# Patient Record
Sex: Female | Born: 1985 | Race: White | Hispanic: No | Marital: Single | Smoking: Never smoker
Health system: Southern US, Community
[De-identification: ages and names within clinical notes are randomized; demographics above are authoritative.]

## PROBLEM LIST (undated history)

## (undated) DIAGNOSIS — M51369 Other intervertebral disc degeneration, lumbar region without mention of lumbar back pain or lower extremity pain: Secondary | ICD-10-CM

## (undated) DIAGNOSIS — R7303 Prediabetes: Secondary | ICD-10-CM

## (undated) DIAGNOSIS — F419 Anxiety disorder, unspecified: Secondary | ICD-10-CM

## (undated) DIAGNOSIS — J45909 Unspecified asthma, uncomplicated: Secondary | ICD-10-CM

## (undated) DIAGNOSIS — R519 Headache, unspecified: Secondary | ICD-10-CM

## (undated) DIAGNOSIS — D649 Anemia, unspecified: Secondary | ICD-10-CM

## (undated) DIAGNOSIS — M5136 Other intervertebral disc degeneration, lumbar region: Secondary | ICD-10-CM

## (undated) HISTORY — DX: Unspecified asthma, uncomplicated: J45.909

## (undated) HISTORY — PX: BACK SURGERY: SHX140

---
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
## 2009-10-31 ENCOUNTER — Ambulatory Visit (HOSPITAL_COMMUNITY): Admission: RE | Admit: 2009-10-31 | Discharge: 2009-10-31 | Payer: Self-pay | Admitting: Obstetrics and Gynecology

## 2009-11-05 ENCOUNTER — Inpatient Hospital Stay (HOSPITAL_COMMUNITY): Admission: AD | Admit: 2009-11-05 | Discharge: 2009-11-07 | Payer: Self-pay | Admitting: Obstetrics and Gynecology

## 2010-12-25 LAB — CBC
HCT: 26.1 % — ABNORMAL LOW (ref 36.0–46.0)
HCT: 37.3 % (ref 36.0–46.0)
Hemoglobin: 12.4 g/dL (ref 12.0–15.0)
Hemoglobin: 8.7 g/dL — ABNORMAL LOW (ref 12.0–15.0)
MCHC: 33.1 g/dL (ref 30.0–36.0)
MCHC: 33.5 g/dL (ref 30.0–36.0)
MCV: 93.3 fL (ref 78.0–100.0)
MCV: 94.5 fL (ref 78.0–100.0)
Platelets: 146 10*3/uL — ABNORMAL LOW (ref 150–400)
Platelets: 193 10*3/uL (ref 150–400)
RBC: 2.77 MIL/uL — ABNORMAL LOW (ref 3.87–5.11)
RBC: 4 MIL/uL (ref 3.87–5.11)
RDW: 13.8 % (ref 11.5–15.5)
RDW: 13.8 % (ref 11.5–15.5)
WBC: 10.4 10*3/uL (ref 4.0–10.5)
WBC: 11 10*3/uL — ABNORMAL HIGH (ref 4.0–10.5)

## 2010-12-25 LAB — RPR: RPR Ser Ql: NONREACTIVE

## 2012-01-17 ENCOUNTER — Emergency Department (HOSPITAL_COMMUNITY)
Admission: EM | Admit: 2012-01-17 | Discharge: 2012-01-18 | Disposition: A | Discharge status: Home / Self Care | Payer: 59 | Attending: Emergency Medicine | Admitting: Emergency Medicine

## 2012-01-17 ENCOUNTER — Encounter (HOSPITAL_COMMUNITY): Payer: Self-pay | Admitting: *Deleted

## 2012-01-17 ENCOUNTER — Emergency Department (HOSPITAL_COMMUNITY): Payer: 59

## 2012-01-17 DIAGNOSIS — S63509A Unspecified sprain of unspecified wrist, initial encounter: Secondary | ICD-10-CM | POA: Insufficient documentation

## 2012-01-17 DIAGNOSIS — S6992XA Unspecified injury of left wrist, hand and finger(s), initial encounter: Secondary | ICD-10-CM

## 2012-01-17 MED ORDER — HYDROCODONE-ACETAMINOPHEN 5-325 MG PO TABS: 1.0000 | ORAL_TABLET | ORAL | Status: AC | PRN

## 2012-01-17 MED ORDER — OXYCODONE-ACETAMINOPHEN 5-325 MG PO TABS
2.0000 | ORAL_TABLET | Freq: Once | ORAL | Status: AC
  Administered 2012-01-17: 2 via ORAL
  Filled 2012-01-17: qty 2

## 2012-01-17 NOTE — ED Notes (Signed)
Pt injured L FA secondary to fall from horse.

## 2012-01-17 NOTE — Discharge Instructions (Signed)
Athletic Injuries Proper early treatment and rehabilitation leads to a quicker recovery for most athletic injuries. You may be able to return to your sport fully recovered in less time if you follow these general rules:   Rest. Rest the injury until movement is no longer painful. Using an injured joint or muscle will prolong the problem.   Elevate. Keep the injured area elevated until most of the swelling and pain are gone. If possible, keep the injured area above the level of your heart.   Ice. Use ice packs directly on the injury for 3 to 4 days.   Compression. Use an elastic bandage applied to your injury as directed. This will reduce swelling, although elastic wraps do not protect injured joints. More rigid splints and taping are better for this purpose.   Rehabilitation. This should begin as soon as the swelling and pain of your injury subside, and as directed by your caregiver. It includes exercises to improve joint motion and muscular strength. Occasionally special braces, splints, or orthotics are used to protect against further injury when you return to your sport.  Keeping a positive attitude will help you heal your injury more rapidly and completely. You may return to physical exercise that does not cause pain or increase the risk of re-injury or as directed. This will help maintain fitness. It will also improve your mental attitude. Do not overuse your injured extremity. This will lead to discomfort and may delay full recovery.  Document Released: 10/29/2004 Document Revised: 09/10/2011 Document Reviewed: 03/19/2009 [facility] Patient Information 2012 [place], [place].Muscle Strain A muscle strain, or pulled muscle, occurs when a muscle is over-stretched. A small number of muscle fibers may also be torn. This is especially common in athletes. This happens when a sudden violent force placed on a muscle pushes it past its capacity. Usually, recovery from a pulled muscle takes 1 to 2 weeks. But  complete healing will take 5 to 6 weeks. There are millions of muscle fibers. Following injury, your body will usually return to normal quickly. HOME CARE INSTRUCTIONS   While awake, apply ice to the sore muscle for 15 to 20 minutes each hour for the first 2 days. Put ice in a plastic bag and place a towel between the bag of ice and your skin.   Do not use the pulled muscle for several days. Do not use the muscle if you have pain.   You may wrap the injured area with an elastic bandage for comfort. Be careful not to bind it too tightly. This may interfere with blood circulation.   Only take over-the-counter or prescription medicines for pain, discomfort, or fever as directed by your caregiver. Do not use aspirin as this will increase bleeding (bruising) at injury site.   Warming up before exercise helps prevent muscle strains.  SEEK MEDICAL CARE IF:  There is increased pain or swelling in the affected area. MAKE SURE YOU:   Understand these instructions.   Will watch your condition.   Will get help right away if you are not doing well or get worse.  Document Released: 09/21/2005 Document Revised: 09/10/2011 Document Reviewed: 04/20/2007 [facility] Patient Information 2012 [place], [place].

## 2012-01-18 NOTE — ED Provider Notes (Signed)
History     CSN: [id number]  Arrival date & time 01/17/12  2104   First MD Initiated Contact with Patient 01/17/12 2154      Chief Complaint  Patient presents with  . Arm Injury    (Consider location/radiation/quality/duration/timing/severity/associated sxs/prior treatment) HPI  Pt presents to the ED with complaints of left wrist pain after being bucked off of a horse. She states that it happened earlier today and that she did not hurt anything else. Did not hit her head, no LOC, no other injuries. She did not realize she broke her fall with her wrist until it started hurting after she calmed down. Now she is guarding and does not want to move it. Pt is in no acute distress but is in a lot of pain   History reviewed. No pertinent past medical history.  History reviewed. No pertinent past surgical history.  History reviewed. No pertinent family history.  History  Substance Use Topics  . Smoking status: Never Smoker   . Smokeless tobacco: Not on file  . Alcohol Use: No    OB History    Grav Para Term Preterm Abortions TAB SAB Ect Mult Living                  Review of Systems  All other systems reviewed and are negative.    Allergies  Review of patient's allergies indicates no known allergies.  Home Medications   Current Outpatient Rx  Name Route Sig Dispense Refill  . AMOXICILLIN 500 MG PO CAPS Oral Take 500 mg by mouth 3 (three) times daily. For 7 days.    Marland Kitchen HYDROCODONE-ACETAMINOPHEN 5-500 MG PO TABS Oral Take 1 tablet by mouth every 6 (six) hours as needed. Pain.    Marland Kitchen HYDROCODONE-ACETAMINOPHEN 5-325 MG PO TABS Oral Take 1 tablet by mouth every 4 (four) hours as needed for pain. 15 tablet 0    BP 111/75  Pulse 71  Temp(Src) 99.2 F (37.3 C) (Oral)  Resp 18  Ht 5\' 7"  (1.702 m)  Wt 206 lb 3.2 oz (93.532 kg)  BMI 32.30 kg/m2  SpO2 100%  LMP 01/16/2012  Physical Exam  Nursing note and vitals reviewed. Constitutional: She appears well-developed and  well-nourished. No distress.  HENT:  Head: Normocephalic and atraumatic.  Eyes: Pupils are equal, round, and reactive to light.  Neck: Normal range of motion. Neck supple.  Cardiovascular: Normal rate and regular rhythm.   Pulmonary/Chest: Effort normal.  Abdominal: Soft.  Musculoskeletal:       Left hand: She exhibits decreased range of motion (Pt will not move wrist), tenderness (very tender) and swelling. She exhibits no bony tenderness, normal two-point discrimination, normal capillary refill, no deformity and no laceration. normal sensation noted. Normal strength noted.  Neurological: She is alert.  Skin: Skin is warm and dry.    ED Course  Procedures (including critical care time)  Labs Reviewed - No data to display Dg Wrist Complete Left  01/17/2012  *RADIOLOGY REPORT*  Clinical Data: Larey Seat off horse.  Pain.  LEFT WRIST - COMPLETE 3+ VIEW  Comparison: None.  Findings: The carpals are aligned.  No acute fracture is identified.  On the lateral view, there may be slight soft tissue swelling along the volar aspect of the wrist.  IMPRESSION:  1.  No acute bony abnormality identified. 2.  Question slight soft tissue swelling volar to the wrist.  If there is continued concern for fracture, consider follow-up radiographs in 7 days.  Original Report  Authenticated By: Britta Mccreedy, M.D.     1. Left wrist injury   2. Wrist sprain       MDM  Due to patients tenderness, I suspect fracture although no obvious fracture seen on x-ray. Pt has pain with pronation and supination therefore sugar tong splint placed then sling. Pt is to see Dr. Mina Marble in 7 days for repeat xray and further management.  Rx for vicodin given. I have discussed symptoms of Compartment syndrome and that she needs to come back immediately if she develops any of the symptoms.  Pt has been advised of the symptoms that warrant their return to the ED. Patient has voiced understanding and has agreed to follow-up with the PCP  or specialist.          Dorthula Matas, PA 01/18/12 0040

## 2012-01-20 NOTE — ED Provider Notes (Signed)
Medical screening examination/treatment/procedure(s) were performed by non-physician practitioner and as supervising physician I was immediately available for consultation/collaboration.  Raeford Razor, MD 01/20/12 [phone]

## 2013-11-13 ENCOUNTER — Ambulatory Visit: Payer: 59 | Admitting: Dietician

## 2015-02-06 ENCOUNTER — Emergency Department: Payer: Commercial Managed Care - PPO

## 2015-02-06 ENCOUNTER — Emergency Department
Admission: EM | Admit: 2015-02-06 | Discharge: 2015-02-06 | Disposition: A | Discharge status: Home / Self Care | Payer: Commercial Managed Care - PPO | Attending: Emergency Medicine | Admitting: Emergency Medicine

## 2015-02-06 DIAGNOSIS — Z79899 Other long term (current) drug therapy: Secondary | ICD-10-CM | POA: Insufficient documentation

## 2015-02-06 DIAGNOSIS — R51 Headache: Secondary | ICD-10-CM | POA: Diagnosis present

## 2015-02-06 DIAGNOSIS — J301 Allergic rhinitis due to pollen: Secondary | ICD-10-CM | POA: Diagnosis not present

## 2015-02-06 MED ORDER — METHYLPREDNISOLONE 4 MG PO TBPK: ORAL_TABLET | ORAL | Status: DC

## 2015-02-06 MED ORDER — DEXAMETHASONE SODIUM PHOSPHATE 10 MG/ML IJ SOLN
INTRAMUSCULAR | Status: AC
  Administered 2015-02-06: 10 mg via INTRAMUSCULAR
  Filled 2015-02-06: qty 1

## 2015-02-06 MED ORDER — FEXOFENADINE-PSEUDOEPHED ER 60-120 MG PO TB12: 1.0000 | ORAL_TABLET | Freq: Two times a day (BID) | ORAL | Status: DC

## 2015-02-06 MED ORDER — DEXAMETHASONE SODIUM PHOSPHATE 10 MG/ML IJ SOLN
10.0000 mg | Freq: Once | INTRAMUSCULAR | Status: AC
  Administered 2015-02-06: 10 mg via INTRAMUSCULAR

## 2015-02-06 NOTE — ED Notes (Signed)
Pt was treated for pneumonia a few months ago states still having bilat earaches, head pressure, and congestion

## 2015-02-06 NOTE — Discharge Instructions (Signed)

## 2015-02-06 NOTE — ED Provider Notes (Signed)
[facility] Emergency Department Provider Note    ____________________________________________  Time seen: 2026  I have reviewed the triage vital signs and the nursing notes.   HISTORY  Chief Complaint Headache  Patient c/o frontal headache, bilateral ear pressure and chest congestion.     HPI Megan Barrera is a 29 y.o. female complaining of frontal headache, bilateral ear pressure, and chest congestion for 4 months.  States onset January this year, diagnosed with pneumonia  and took two dosage of antibiotics.  Patient has follow up with Urgent Care clinics and not getting better. Rate her facial pain as 8/10. States pain is 2nd to pressure. Currently of Antibiotic for sinus infection.     No past medical history on file.  There are no active problems to display for this patient.   No past surgical history on file.  Current Outpatient Rx  Name  Route  Sig  Dispense  Refill  . amoxicillin (AMOXIL) 500 MG capsule   Oral   Take 500 mg by mouth 3 (three) times daily. For 7 days.         . fexofenadine-pseudoephedrine (ALLEGRA-D) 60-120 MG per tablet   Oral   Take 1 tablet by mouth 2 (two) times daily.   30 tablet   0   . HYDROcodone-acetaminophen (VICODIN) 5-500 MG per tablet   Oral   Take 1 tablet by mouth every 6 (six) hours as needed. Pain.         . methylPREDNISolone (MEDROL DOSEPAK) 4 MG TBPK tablet      Take Tapered dose as directed   21 tablet   0     Allergies Review of patient's allergies indicates no known allergies.  No family history on file.  Social History History  Substance Use Topics  . Smoking status: Never Smoker   . Smokeless tobacco: Not on file  . Alcohol Use: No    Review of Systems  Constitutional: Negative for fever. Eyes: Negative for visual changes. ENT: Negative for sore throat. Positive for facial/ear pressure and pain. Cardiovascular: Negative for chest pain. Respiratory: Negative for  shortness of breath. Gastrointestinal: Negative for abdominal pain, vomiting and diarrhea. Genitourinary: Negative for dysuria. Musculoskeletal: Negative for back pain. Skin: Negative for rash. Neurological: Positive for frontal headaches. No focal weakness or numbness. Psychiatric:None Endocrine:None Hematological/Lymphatic:None Allergic/Immunilogical: None  10-point ROS otherwise negative.  ____________________________________________   PHYSICAL EXAM:  VITAL SIGNS: ED Triage Vitals  Enc Vitals Group     BP 02/06/15 1949 132/94 mmHg     Pulse Rate 02/06/15 1949 75     Resp --      Temp 02/06/15 1949 97.5 F (36.4 C)     Temp Source 02/06/15 1949 Oral     SpO2 02/06/15 1949 100 %     Weight 02/06/15 1949 240 lb (108.863 kg)     Height 02/06/15 1949 5\' 6"  (1.676 m)     Head Cir --      Peak Flow --      Pain Score 02/06/15 1950 8     Pain Loc --      Pain Edu? --      Excl. in GC? --      Constitutional: Alert and oriented. Well appearing and in no distress. Eyes: Conjunctivae are normal. PERRL. Normal extraocular movements. ENT   Head: Normocephalic and atraumatic.   Nose: Clear Rhinorrhea/edamatous nasal turbinates.   Mouth/Throat: Mucous membranes are moist.   Neck: No stridor. Hematological/Lymphatic/Immunilogical: No cervical  lymphadenopathy. Cardiovascular: Normal rate, regular rhythm. Normal and symmetric distal pulses are present in all extremities. No murmurs, rubs, or gallops. Respiratory: Normal respiratory effort without tachypnea nor retractions. Breath sounds are clear and equal bilaterally. No wheezes/rales/rhonchi. Gastrointestinal: Soft and nontender. No distention. No abdominal bruits. There is no CVA tenderness. Genitourinary: Not examined Musculoskeletal: Nontender with normal range of motion in all extremities. No joint effusions.  No lower extremity tenderness nor edema. Neurologic:  Normal speech and language. No gross focal  neurologic deficits are appreciated. Speech is normal. No gait instability. Skin:  Skin is warm, dry and intact. No rash noted. Psychiatric: Mood and affect are normal. Speech and behavior are normal. Patient exhibits appropriate insight and judgment.  ____________________________________________    LABS (pertinent positives/negatives)    ____________________________________________   EKG    ____________________________________________    RADIOLOGY  Neg sinus and chest X-ray  ____________________________________________   PROCEDURES  Procedure(s) performed: None  Critical Care performed: No  ____________________________________________   INITIAL IMPRESSION / ASSESSMENT AND PLAN / ED COURSE  Pertinent labs & imaging results that were available during my care of the patient were reviewed by me and considered in my medical decision making (see chart for details).  Allergic rhinitis  ____________________________________________   FINAL CLINICAL IMPRESSION(S) / ED DIAGNOSES  Final diagnoses:  Allergic rhinitis due to pollen     Joni ReiningRonald K Smith, PA-C 02/06/15 2154  Arelia Longestavid M Schaevitz, MD 02/07/15 [phone]

## 2016-03-06 ENCOUNTER — Other Ambulatory Visit: Payer: Self-pay | Admitting: *Deleted

## 2016-03-06 NOTE — Telephone Encounter (Addendum)
Received fax for refill Omeprazole 40 mg denied refill patient needs ov last ov 04/16/15 per paper chart patient to f/u in 6 months

## 2016-03-11 ENCOUNTER — Emergency Department (HOSPITAL_COMMUNITY)
Admission: EM | Admit: 2016-03-11 | Discharge: 2016-03-11 | Disposition: A | Discharge status: Home / Self Care | Payer: Commercial Managed Care - PPO | Attending: Emergency Medicine | Admitting: Emergency Medicine

## 2016-03-11 ENCOUNTER — Encounter (HOSPITAL_COMMUNITY): Payer: Self-pay | Admitting: Emergency Medicine

## 2016-03-11 DIAGNOSIS — M549 Dorsalgia, unspecified: Secondary | ICD-10-CM

## 2016-03-11 DIAGNOSIS — Z79899 Other long term (current) drug therapy: Secondary | ICD-10-CM | POA: Insufficient documentation

## 2016-03-11 DIAGNOSIS — Z7952 Long term (current) use of systemic steroids: Secondary | ICD-10-CM | POA: Insufficient documentation

## 2016-03-11 DIAGNOSIS — Z792 Long term (current) use of antibiotics: Secondary | ICD-10-CM | POA: Insufficient documentation

## 2016-03-11 DIAGNOSIS — M545 Low back pain: Secondary | ICD-10-CM | POA: Insufficient documentation

## 2016-03-11 HISTORY — DX: Other intervertebral disc degeneration, lumbar region: M51.36

## 2016-03-11 HISTORY — DX: Other intervertebral disc degeneration, lumbar region without mention of lumbar back pain or lower extremity pain: M51.369

## 2016-03-11 MED ORDER — HYDROCODONE-ACETAMINOPHEN 5-325 MG PO TABS: 1.0000 | ORAL_TABLET | ORAL | Status: DC | PRN

## 2016-03-11 MED ORDER — HYDROCODONE-ACETAMINOPHEN 5-325 MG PO TABS
2.0000 | ORAL_TABLET | Freq: Once | ORAL | Status: AC
  Administered 2016-03-11: 2 via ORAL
  Filled 2016-03-11: qty 2

## 2016-03-11 NOTE — Discharge Instructions (Signed)
Take the prescribed medication as directed.  You may take this with the mobic and robaxin. Follow-up with your spine doctor/surgeon. Return to the ED for new or worsening symptoms.

## 2016-03-11 NOTE — ED Provider Notes (Signed)
CSN: [id number]     Arrival date & time 03/11/16  0556 History   First MD Initiated Contact with Patient 03/11/16 0615     Chief Complaint  Patient presents with  . Back Pain     (Consider location/radiation/quality/duration/timing/severity/associated sxs/prior Treatment) Patient is a 30 y.o. female presenting with back pain. The history is provided by the patient and medical records.  Back Pain   29 y.o. F with hx of DDD, presenting to the ED for back pain.  Patient states she was diagnosed with DDD several years ago.  She states over the past week or so her back has been more painful than normal.  Denies any new injury, trauma, or falls.  States she [facility], was started on mobic and robaxin which does not seem to be helping.  She states her right leg feels tingly like it is "asleep".  Denies numbness or weakness.  No bowel or bladder incontinence.  States she is scheduled to see neurosurgeon at the end of the month to possibly start spinal injections vs surgical intervention.  No fever, chills, sweats.  No hx of cancer or IVDU.  VSS.  Past Medical History  Diagnosis Date  . DDD (degenerative disc disease), lumbar    History reviewed. No pertinent past surgical history. No family history on file. Social History  Substance Use Topics  . Smoking status: Never Smoker   . Smokeless tobacco: None  . Alcohol Use: No   OB History    Gravida Para Term Preterm AB TAB SAB Ectopic Multiple Living   1 1 1             Review of Systems  Musculoskeletal: Positive for back pain.  All other systems reviewed and are negative.     Allergies  Review of patient's allergies indicates no known allergies.  Home Medications   Prior to Admission medications   Medication Sig Start Date End Date Taking? Authorizing Provider  amoxicillin (AMOXIL) 500 MG capsule Take 500 mg by mouth 3 (three) times daily. For 7 days. 01/14/12   Historical Provider, MD   fexofenadine-pseudoephedrine (ALLEGRA-D) 60-120 MG per tablet Take 1 tablet by mouth 2 (two) times daily. 02/06/15   Joni Reiningonald K Smith, PA-C  HYDROcodone-acetaminophen (VICODIN) 5-500 MG per tablet Take 1 tablet by mouth every 6 (six) hours as needed. Pain.    Historical Provider, MD  methylPREDNISolone (MEDROL DOSEPAK) 4 MG TBPK tablet Take Tapered dose as directed 02/06/15   Joni Reiningonald K Smith, PA-C   BP 126/71 mmHg  Pulse 79  Temp(Src) 97.9 F (36.6 C) (Oral)  Resp 20  Ht 5\' 6"  (1.676 m)  Wt 105.235 kg  BMI 37.46 kg/m2  SpO2 99%  LMP 12/04/2015   Physical Exam  Constitutional: She is oriented to person, place, and time. She appears well-developed and well-nourished. No distress.  HENT:  Head: Normocephalic and atraumatic.  Mouth/Throat: Oropharynx is clear and moist.  Eyes: Conjunctivae and EOM are normal. Pupils are equal, round, and reactive to light.  Neck: Normal range of motion. Neck supple.  Cardiovascular: Normal rate, regular rhythm and normal heart sounds.   Pulmonary/Chest: Effort normal and breath sounds normal. No respiratory distress. She has no wheezes.  Abdominal: Soft. Bowel sounds are normal. There is no tenderness. There is no guarding.  Musculoskeletal: Normal range of motion.  TTP of midline lumbar spine as well as right SI joint; no deformities noted; + SLR on right, - on left; normal  strength and sensation of BLE; no foot drop; normal gait;   Neurological: She is alert and oriented to person, place, and time.  Skin: Skin is warm and dry. She is not diaphoretic.  Psychiatric: She has a normal mood and affect.  Nursing note and vitals reviewed.   ED Course  Procedures (including critical care time) Labs Review Labs Reviewed - No data to display  Imaging Review No results found. I have personally reviewed and evaluated these images and lab results as part of my medical decision-making.   EKG Interpretation None      MDM   Final diagnoses:  Back pain,  unspecified location   30 year old female is here with back pain. This seems chronic in nature for the past several years. Seen yesterday at spine clinic and started on Mobic and Robaxin without relief. Patient is afebrile, nontoxic. She has tenderness of her lumbar spine as well as the right SI joint without noted deformity. Positive straight leg raise on right. No numbness, weakness, or bowel or bladder incontinence to suggest cauda equina.  Do not suspect acute fracture without recent trauma.  Do not feel she needs emergent MRI at this time.  Exam findings are more consistent with a lumbar radiculopathy.  Will prescribe short course norco to help with pain.  Scheduled to see neurosurgeon later this month for further management.  Discussed plan with patient, he/she acknowledged understanding and agreed with plan of care.  Return precautions given for new or worsening symptoms.  Garlon Hatchet, PA-C 03/11/16 6962  Layla Maw Ward, DO 03/11/16 [phone]

## 2016-03-11 NOTE — ED Notes (Signed)
Patient to ED c/o worsening back pain r/t to her DDD since yesterday. She was [facility] and was sent home with a prescription for Mobic, which has not helped in addition to her methocarbamol. Patient states her pain is so bad she can hardly walk. CMS intact all extremities, but reports pain with movement.

## 2017-09-29 ENCOUNTER — Other Ambulatory Visit: Payer: Self-pay | Admitting: Orthopaedic Surgery

## 2017-09-29 DIAGNOSIS — M5136 Other intervertebral disc degeneration, lumbar region: Secondary | ICD-10-CM

## 2017-09-30 ENCOUNTER — Ambulatory Visit
Admission: RE | Admit: 2017-09-30 | Discharge: 2017-09-30 | Disposition: A | Discharge status: Home / Self Care | Payer: Commercial Managed Care - PPO | Source: Ambulatory Visit | Attending: Orthopaedic Surgery | Admitting: Orthopaedic Surgery

## 2017-09-30 DIAGNOSIS — M5136 Other intervertebral disc degeneration, lumbar region: Secondary | ICD-10-CM

## 2018-02-22 ENCOUNTER — Other Ambulatory Visit
Admission: RE | Admit: 2018-02-22 | Discharge: 2018-02-22 | Disposition: A | Discharge status: Home / Self Care | Payer: Managed Care, Other (non HMO) | Source: Ambulatory Visit | Attending: Pediatrics | Admitting: Pediatrics

## 2018-02-22 DIAGNOSIS — R509 Fever, unspecified: Secondary | ICD-10-CM | POA: Insufficient documentation

## 2018-02-22 DIAGNOSIS — M7989 Other specified soft tissue disorders: Secondary | ICD-10-CM | POA: Diagnosis not present

## 2018-02-22 LAB — FIBRIN DERIVATIVES D-DIMER (ARMC ONLY): Fibrin derivatives D-dimer (ARMC): 398.74 ng/mL (FEU) (ref 0.00–499.00)

## 2018-02-24 ENCOUNTER — Emergency Department (HOSPITAL_COMMUNITY)
Admission: EM | Admit: 2018-02-24 | Discharge: 2018-02-25 | Disposition: A | Discharge status: Home / Self Care | Payer: Managed Care, Other (non HMO) | Attending: Emergency Medicine | Admitting: Emergency Medicine

## 2018-02-24 ENCOUNTER — Other Ambulatory Visit: Payer: Self-pay

## 2018-02-24 ENCOUNTER — Encounter (HOSPITAL_COMMUNITY): Payer: Self-pay | Admitting: Emergency Medicine

## 2018-02-24 DIAGNOSIS — R509 Fever, unspecified: Secondary | ICD-10-CM | POA: Diagnosis present

## 2018-02-24 DIAGNOSIS — Z79899 Other long term (current) drug therapy: Secondary | ICD-10-CM | POA: Diagnosis not present

## 2018-02-24 DIAGNOSIS — R52 Pain, unspecified: Secondary | ICD-10-CM | POA: Diagnosis not present

## 2018-02-24 DIAGNOSIS — Z9189 Other specified personal risk factors, not elsewhere classified: Secondary | ICD-10-CM

## 2018-02-24 LAB — URINALYSIS, ROUTINE W REFLEX MICROSCOPIC
BILIRUBIN URINE: NEGATIVE
Glucose, UA: NEGATIVE mg/dL
Hgb urine dipstick: NEGATIVE
KETONES UR: NEGATIVE mg/dL
LEUKOCYTES UA: NEGATIVE
Nitrite: NEGATIVE
Protein, ur: NEGATIVE mg/dL
SPECIFIC GRAVITY, URINE: 1.023 (ref 1.005–1.030)
pH: 5 (ref 5.0–8.0)

## 2018-02-24 LAB — CBC WITH DIFFERENTIAL/PLATELET
Basophils Absolute: 0 10*3/uL (ref 0.0–0.1)
Basophils Relative: 1 %
EOS ABS: 0.2 10*3/uL (ref 0.0–0.7)
Eosinophils Relative: 9 %
HCT: 39.8 % (ref 36.0–46.0)
Hemoglobin: 12.7 g/dL (ref 12.0–15.0)
LYMPHS ABS: 0.6 10*3/uL — AB (ref 0.7–4.0)
Lymphocytes Relative: 33 %
MCH: 29.2 pg (ref 26.0–34.0)
MCHC: 31.9 g/dL (ref 30.0–36.0)
MCV: 91.5 fL (ref 78.0–100.0)
MONO ABS: 0.3 10*3/uL (ref 0.1–1.0)
Monocytes Relative: 15 %
NEUTROS PCT: 42 %
Neutro Abs: 0.7 10*3/uL — ABNORMAL LOW (ref 1.7–7.7)
PLATELETS: 255 10*3/uL (ref 150–400)
RBC: 4.35 MIL/uL (ref 3.87–5.11)
RDW: 12.3 % (ref 11.5–15.5)
WBC: 1.8 10*3/uL — AB (ref 4.0–10.5)

## 2018-02-24 LAB — COMPREHENSIVE METABOLIC PANEL
ALK PHOS: 85 U/L (ref 38–126)
ALT: 37 U/L (ref 14–54)
ANION GAP: 6 (ref 5–15)
AST: 45 U/L — AB (ref 15–41)
Albumin: 3.6 g/dL (ref 3.5–5.0)
BILIRUBIN TOTAL: 0.4 mg/dL (ref 0.3–1.2)
BUN: 6 mg/dL (ref 6–20)
CALCIUM: 8.6 mg/dL — AB (ref 8.9–10.3)
CO2: 28 mmol/L (ref 22–32)
Chloride: 104 mmol/L (ref 101–111)
Creatinine, Ser: 0.86 mg/dL (ref 0.44–1.00)
GFR calc Af Amer: >60 mL/min (ref >60)
GFR calc non Af Amer: >60 mL/min (ref >60)
GLUCOSE: 95 mg/dL (ref 65–99)
Potassium: 3.5 mmol/L (ref 3.5–5.1)
SODIUM: 138 mmol/L (ref 135–145)
TOTAL PROTEIN: 7 g/dL (ref 6.5–8.1)

## 2018-02-24 LAB — I-STAT CG4 LACTIC ACID, ED
Lactic Acid, Venous: 0.75 mmol/L (ref 0.5–1.9)
Lactic Acid, Venous: 0.51 mmol/L (ref 0.5–1.9)

## 2018-02-24 LAB — CK: Total CK: 439 U/L — ABNORMAL HIGH (ref 38–234)

## 2018-02-24 MED ORDER — KETOROLAC TROMETHAMINE 30 MG/ML IJ SOLN
30.0000 mg | Freq: Once | INTRAMUSCULAR | Status: AC
  Administered 2018-02-25: 30 mg via INTRAVENOUS
  Filled 2018-02-24: qty 1

## 2018-02-24 MED ORDER — ACETAMINOPHEN 325 MG PO TABS
650.0000 mg | ORAL_TABLET | Freq: Once | ORAL | Status: AC | PRN
  Administered 2018-02-24: 650 mg via ORAL
  Filled 2018-02-24: qty 2

## 2018-02-24 MED ORDER — MORPHINE SULFATE (PF) 4 MG/ML IV SOLN
4.0000 mg | Freq: Once | INTRAVENOUS | Status: AC
  Administered 2018-02-24: 4 mg via INTRAVENOUS
  Filled 2018-02-24: qty 1

## 2018-02-24 MED ORDER — SODIUM CHLORIDE 0.9 % IV BOLUS
1000.0000 mL | Freq: Once | INTRAVENOUS | Status: AC
  Administered 2018-02-24: 1000 mL via INTRAVENOUS

## 2018-02-24 NOTE — ED Triage Notes (Signed)
Pt reports headache, fever, nausea, generalized body aches starting Tuesday morning. Pt was seen at [facility] clinic and given doxycycline. Pt reports she had a tick crawling on foot Saturday after pulling them off her horse. Pt reports no improvement, temp of 103 at home last night.

## 2018-02-24 NOTE — ED Provider Notes (Signed)
Patient placed in Quick Look pathway, seen and evaluated   Chief Complaint: fever, fatigue  HPI:   Pt is on doxycycline for a tick born illness  ROS: bodyaches, fever  Physical Exam:   Gen: No distress  Neuro: Awake and Alert  Skin: Warm    Focused Exam: Lungs clear, Heart RRR    Initiation of care has begun. The patient has been counseled on the process, plan, and necessity for staying for the completion/evaluation, and the remainder of the medical screening examination   Megan Barrera 02/24/18 1731    Gerhard Munch, MD 02/26/18 [phone]

## 2018-02-25 LAB — PATHOLOGIST SMEAR REVIEW

## 2018-02-25 MED ORDER — IBUPROFEN 800 MG PO TABS: 800.0000 mg | ORAL_TABLET | Freq: Three times a day (TID) | ORAL | 0 refills | Status: DC | PRN

## 2018-02-25 MED ORDER — OXYCODONE-ACETAMINOPHEN 5-325 MG PO TABS: 1.0000 | ORAL_TABLET | Freq: Four times a day (QID) | ORAL | 0 refills | Status: DC | PRN

## 2018-02-25 MED ORDER — DOXYCYCLINE HYCLATE 100 MG PO CAPS: 100.0000 mg | ORAL_CAPSULE | Freq: Two times a day (BID) | ORAL | 0 refills | Status: DC

## 2018-02-25 NOTE — Discharge Instructions (Addendum)
Return here as needed.  Follow-up with your primary doctor.  Increase your fluid intake and rest as much as possible. °

## 2018-02-25 NOTE — ED Provider Notes (Signed)
[facility] EMERGENCY DEPARTMENT Provider Note   CSN: [id number] Arrival date & time: 02/24/18  1707     History   Chief Complaint Chief Complaint  Patient presents with  . Fever  . Generalized Body Aches  . Migraine    HPI Megan Barrera is a 32 y.o. female.  HPI Patient presents to the emergency department with fever body aches sore throat and headache over the last 4 days.  Patient states this past Saturday she found a tick on her foot.  Patient is being treated with doxycycline by her doctor.  The initial Lyme testing was negative and the  RMFS testing was negative.  The patient states that she has had no significant cough.  Patient states that she does take Tylenol and Motrin which seems to temporarily relieve her symptoms.  The patient denies chest pain, shortness of breath, blurred vision, neck pain cough, weakness, numbness, dizziness, anorexia, edema, abdominal pain, nausea, vomiting, diarrhea, rash, back pain, dysuria, hematemesis, bloody stool, near syncope, or syncope. Past Medical History:  Diagnosis Date  . DDD (degenerative disc disease), lumbar     There are no active problems to display for this patient.   Past Surgical History:  Procedure Laterality Date  . BACK SURGERY       OB History    Gravida  1   Para  1   Term  1   Preterm      AB      Living        SAB      TAB      Ectopic      Multiple      Live Births               Home Medications    Prior to Admission medications   Medication Sig Start Date End Date Taking? Authorizing Provider  doxycycline (VIBRAMYCIN) 100 MG capsule Take 100 mg by mouth 2 (two) times daily. 02/22/18  Yes [provider]  meloxicam (MOBIC) 7.5 MG tablet Take 7.5 mg by mouth daily. 03/10/16  Yes [provider]  methocarbamol (ROBAXIN) 500 MG tablet Take 500 mg by mouth 3 (three) times daily. 03/10/16  Yes [provider]  omeprazole (PRILOSEC) 40 MG  capsule Take 40 mg by mouth daily. 12/06/15  Yes [provider]  ondansetron (ZOFRAN-ODT) 4 MG disintegrating tablet Take 4 mg by mouth every 8 (eight) hours as needed for nausea. 02/22/18  Yes [provider]  HYDROcodone-acetaminophen (NORCO/VICODIN) 5-325 MG tablet Take 1 tablet by mouth every 4 (four) hours as needed. Patient not taking: Reported on 02/24/2018 03/11/16   Garlon Hatchet, PA-C    Family History No family history on file.  Social History Social History   Tobacco Use  . Smoking status: Never Smoker  . Smokeless tobacco: Never Used  Substance Use Topics  . Alcohol use: Yes    Comment: occasionally  . Drug use: No     Allergies   Meloxicam and Tetanus toxoids   Review of Systems Review of Systems All other systems negative except as documented in the HPI. All pertinent positives and negatives as reviewed in the HPI.  Physical Exam Updated Vital Signs BP 101/66   Pulse 63   Temp 99.3 F (37.4 C) (Oral)   Resp 20   Ht  (1.676 m)   Wt 117.5 kg (259 lb)   LMP 02/08/2018   SpO2 97%   BMI 41.80 kg/m  Physical Exam  Constitutional: She is oriented to person, place, and time. She appears well-developed and well-nourished. No distress.  HENT:  Head: Normocephalic and atraumatic.  Mouth/Throat: Oropharynx is clear and moist.  Eyes: Pupils are equal, round, and reactive to light.  Neck: Trachea normal and normal range of motion. Neck supple. No spinous process tenderness present. No neck rigidity. Normal range of motion present. No thyroid mass present.  Cardiovascular: Normal rate, regular rhythm and normal heart sounds. Exam reveals no gallop and no friction rub.  No murmur heard. Pulmonary/Chest: Effort normal and breath sounds normal. No respiratory distress. She has no wheezes.  Abdominal: Soft. Bowel sounds are normal. She exhibits no distension and no mass. There is no tenderness. There is no rebound and no guarding.  Neurological:  She is alert and oriented to person, place, and time. No sensory deficit. She exhibits normal muscle tone. Coordination normal.  Skin: Skin is warm and dry. Capillary refill takes less than 2 seconds. No rash noted. No erythema.  Psychiatric: She has a normal mood and affect. Her behavior is normal.  Nursing note and vitals reviewed.    ED Treatments / Results  Labs (all labs ordered are listed, but only abnormal results are displayed) Labs Reviewed  CBC WITH DIFFERENTIAL/PLATELET - Abnormal; Notable for the following components:      Result Value   WBC 1.8 (*)    Neutro Abs 0.7 (*)    Lymphs Abs 0.6 (*)    All other components within normal limits  COMPREHENSIVE METABOLIC PANEL - Abnormal; Notable for the following components:   Calcium 8.6 (*)    AST 45 (*)    All other components within normal limits  URINALYSIS, ROUTINE W REFLEX MICROSCOPIC - Abnormal; Notable for the following components:   Bacteria, UA RARE (*)    All other components within normal limits  CK - Abnormal; Notable for the following components:   Total CK 439 (*)    All other components within normal limits  I-STAT CG4 LACTIC ACID, ED  I-STAT CG4 LACTIC ACID, ED    EKG EKG Interpretation  Date/Time:  Friday Feb 25 2018 00:05:47 EDT Ventricular Rate:  64 PR Interval:    QRS Duration: 97 QT Interval:  418 QTC Calculation: 432 R Axis:   -67 Text Interpretation:  Sinus rhythm S1,S2,S3 pattern Low voltage, precordial leads Borderline T abnormalities, anterior leads No old tracing to compare Confirmed by Mancel Bale [phone]) on 02/25/2018 12:31:21 AM   Radiology No results found.  Procedures Procedures (including critical care time)  Medications Ordered in ED Medications  ketorolac (TORADOL) 30 MG/ML injection 30 mg (has no administration in time range)  acetaminophen (TYLENOL) tablet 650 mg (650 mg Oral Given 02/24/18 1730)  sodium chloride 0.9 % bolus 1,000 mL (0 mLs Intravenous Stopped 02/24/18  2155)  sodium chloride 0.9 % bolus 1,000 mL (0 mLs Intravenous Stopped 02/24/18 2328)  morphine 4 MG/ML injection 4 mg (4 mg Intravenous Given 02/24/18 2159)     Initial Impression / Assessment and Plan / ED Course  I have reviewed the triage vital signs and the nursing notes.  Pertinent labs & imaging results that were available during my care of the patient were reviewed by me and considered in my medical decision making (see chart for details).     The patient does not have any nuchal rigidity has full range of motion of her neck.  I do feel that her symptoms are consistent with a tickborne type  illness based on the fact that she does have headaches significant body aches fever.  At this point she does not appear septic and does not show any signs of meningitis.  Final Clinical Impressions(s) / ED Diagnoses   Final diagnoses:  None    ED Discharge Orders    None       Charlestine Night, PA-C 02/25/18 0101    Mancel Bale, MD 02/25/18 1434

## 2020-06-07 DIAGNOSIS — N970 Female infertility associated with anovulation: Secondary | ICD-10-CM | POA: Insufficient documentation

## 2020-06-07 DIAGNOSIS — Z8669 Personal history of other diseases of the nervous system and sense organs: Secondary | ICD-10-CM | POA: Insufficient documentation

## 2020-06-07 DIAGNOSIS — N915 Oligomenorrhea, unspecified: Secondary | ICD-10-CM | POA: Insufficient documentation

## 2020-11-07 ENCOUNTER — Encounter: Payer: Self-pay | Admitting: Nurse Practitioner

## 2020-11-07 ENCOUNTER — Ambulatory Visit (INDEPENDENT_AMBULATORY_CARE_PROVIDER_SITE_OTHER): Payer: Managed Care, Other (non HMO) | Admitting: Nurse Practitioner

## 2020-11-07 ENCOUNTER — Other Ambulatory Visit: Payer: Self-pay | Admitting: Nurse Practitioner

## 2020-11-07 ENCOUNTER — Other Ambulatory Visit: Payer: Self-pay

## 2020-11-07 VITALS — BP 128/88 | HR 68 | Temp 97.8°F | Ht 66.0 in | Wt 268.0 lb

## 2020-11-07 DIAGNOSIS — J4520 Mild intermittent asthma, uncomplicated: Secondary | ICD-10-CM

## 2020-11-07 DIAGNOSIS — Z6841 Body Mass Index (BMI) 40.0 and over, adult: Secondary | ICD-10-CM | POA: Diagnosis not present

## 2020-11-07 DIAGNOSIS — Z808 Family history of malignant neoplasm of other organs or systems: Secondary | ICD-10-CM | POA: Diagnosis not present

## 2020-11-07 DIAGNOSIS — Z0000 Encounter for general adult medical examination without abnormal findings: Secondary | ICD-10-CM

## 2020-11-07 DIAGNOSIS — Z811 Family history of alcohol abuse and dependence: Secondary | ICD-10-CM

## 2020-11-07 DIAGNOSIS — L732 Hidradenitis suppurativa: Secondary | ICD-10-CM

## 2020-11-07 MED ORDER — WEGOVY 0.25 MG/0.5ML ~~LOC~~ SOAJ: 0.2500 mg | SUBCUTANEOUS | 2 refills | Status: DC

## 2020-11-07 NOTE — Progress Notes (Signed)
New Patient Office Visit  Subjective:  Patient ID: Megan Barrera, female    DOB: 1986-05-07  Age: 35 y.o. MRN: [id number]  CC:  Chief Complaint  Patient presents with  . New to establish    HPI Megan Barrera presents to establish care with a primary care provider. She tells me she has a past medical history of asthma that is well-controlled with Albuterol inhaler as needed. She denies recent exacerbations or hospitalizations related to asthma. She tells me she has chronic bilateral axillary hidradenitis suppurative that is treated with Doxycyline as needed. She is concerned with recent weight gain. She tells me she has gained an excessive amount of weight after a COVID-19 infection in July 2021. She states she is physically active with softball and does not consume excess calories. BMI 43.25 in office today. She is interested in weight management.   Past Medical History:  Diagnosis Date  . Asthma   . DDD (degenerative disc disease), lumbar     Past Surgical History:  Procedure Laterality Date  . BACK SURGERY      Family History  Problem Relation Age of Onset  . Cirrhosis Father   . Breast cancer Paternal Aunt   . Diabetes Maternal Grandmother   . Hypertension Maternal Grandmother     Social History   Socioeconomic History  . Marital status: Single    Spouse name: Not on file  . Number of children: Not on file  . Years of education: Not on file  . Highest education level: Not on file  Occupational History  . Not on file  Tobacco Use  . Smoking status: Never Smoker  . Smokeless tobacco: Never Used  Substance and Sexual Activity  . Alcohol use: Yes    Comment: occasionally  . Drug use: No  . Sexual activity: Yes    Birth control/protection: None  Other Topics Concern  . Not on file  Social History Narrative  . Not on file   Social Determinants of Health   Financial Resource Strain: Not on file  Food Insecurity: Not on file  Transportation Needs: Not on  file  Physical Activity: Not on file  Stress: Not on file  Social Connections: Not on file  Intimate Partner Violence: Not on file    ROS Review of Systems  Constitutional: Positive for unexpected weight change. Negative for fatigue and fever.  HENT: Negative for congestion, ear pain, sinus pressure and sore throat.   Eyes: Negative for pain.  Respiratory: Negative for cough, chest tightness, shortness of breath and wheezing.   Cardiovascular: Negative for chest pain and palpitations.  Gastrointestinal: Negative for abdominal pain, constipation, diarrhea, nausea and vomiting.  Genitourinary: Negative for dysuria and hematuria.  Musculoskeletal: Negative for arthralgias, back pain, joint swelling and myalgias.  Skin: Negative for rash.       Bilateral chronic axillary hidradenitis suppurativa  Neurological: Negative for dizziness, weakness and headaches.  Psychiatric/Behavioral: Negative for dysphoric mood. The patient is not nervous/anxious.     Objective:   Today's Vitals: BP 128/88 (BP Location: Left Arm, Patient Position: Sitting)   Pulse 68   Temp 97.8 F (36.6 C) (Temporal)   Ht 5\' 6"  (1.676 m)   Wt 268 lb (121.6 kg)   SpO2 98%   BMI 43.26 kg/m   Physical Exam Constitutional:      Appearance: Normal appearance. She is obese.  HENT:     Head: Normocephalic.     Right Ear: Tympanic membrane, ear canal and  external ear normal.     Left Ear: Tympanic membrane, ear canal and external ear normal.     Nose: Nose normal.     Mouth/Throat:     Mouth: Mucous membranes are moist.  Eyes:     Pupils: Pupils are equal, round, and reactive to light.  Cardiovascular:     Rate and Rhythm: Normal rate and regular rhythm.     Pulses: Normal pulses.     Heart sounds: Normal heart sounds.  Pulmonary:     Effort: Pulmonary effort is normal.     Breath sounds: Normal breath sounds.  Abdominal:     General: Bowel sounds are normal.     Palpations: Abdomen is soft.   Musculoskeletal:        General: Normal range of motion.     Cervical back: Normal range of motion.  Skin:    General: Skin is warm and dry.     Capillary Refill: Capillary refill takes less than 2 seconds.  Neurological:     General: No focal deficit present.     Mental Status: She is alert and oriented to person, place, and time.  Psychiatric:        Mood and Affect: Mood normal.        Behavior: Behavior normal.        Thought Content: Thought content normal.        Judgment: Judgment normal.     Assessment & Plan:   1. Encounter for medical examination to establish care - CBC with Differential/Platelet - Comprehensive metabolic panel - Lipid panel - TSH  2. Family history of thyroid cancer  3. BMI 40.0-44.9, adult [facility]) - Cardiovascular Risk Assessment  4. Family history of alcoholism in father  5. Hydradenitis-well controlled -Continue Doxycycline as prescribed  6. Mild intermittent asthma without complication-well controlled -Continue Albuterol as prescribed  Follow low-carb, heart healthy diet Continue physical activity Begin Wegovy as directed Notify office of any side effects We will call you with lab results Return in 6-weeks for weight managment     Outpatient Encounter Medications as of 11/07/2020  Medication Sig  . doxycycline (VIBRAMYCIN) 100 MG capsule Take 1 capsule (100 mg total) by mouth 2 (two) times daily.  . [DISCONTINUED] doxycycline (VIBRAMYCIN) 100 MG capsule Take 100 mg by mouth 2 (two) times daily.  . [DISCONTINUED] HYDROcodone-acetaminophen (NORCO/VICODIN) 5-325 MG tablet Take 1 tablet by mouth every 4 (four) hours as needed. (Patient not taking: Reported on 02/24/2018)  . [DISCONTINUED] ibuprofen (ADVIL,MOTRIN) 800 MG tablet Take 1 tablet (800 mg total) by mouth every 8 (eight) hours as needed.  . [DISCONTINUED] meloxicam (MOBIC) 7.5 MG tablet Take 7.5 mg by mouth daily.  . [DISCONTINUED] methocarbamol (ROBAXIN) 500 MG tablet Take 500  mg by mouth 3 (three) times daily.  . [DISCONTINUED] omeprazole (PRILOSEC) 40 MG capsule Take 40 mg by mouth daily.  . [DISCONTINUED] ondansetron (ZOFRAN-ODT) 4 MG disintegrating tablet Take 4 mg by mouth every 8 (eight) hours as needed for nausea.  . [DISCONTINUED] oxyCODONE-acetaminophen (PERCOCET/ROXICET) 5-325 MG tablet Take 1 tablet by mouth every 6 (six) hours as needed for severe pain.   No facility-administered encounter medications on file as of 11/07/2020.    Follow-up:  6-weeks  Flonnie Hailstone, DNP

## 2020-11-07 NOTE — Patient Instructions (Addendum)
Follow low-carb, heart healthy diet Continue physical activity Begin Wegovy as directed Notify office of any side effects We will call you with lab results Return in 6-weeks for weight managment  Diet for Polycystic Ovary Syndrome Polycystic ovary syndrome (PCOS) is a common hormonal disorder that affects a woman's reproductive system. It can cause problems with menstrual periods and make it hard to get and stay pregnant. Changing what you eat can help your hormones reach normal levels, improve your health, and help you better manage PCOS. Following a balanced diet can help you lose weight and improve the way that your body uses the hormone insulin to control blood sugar. This may include:  Eating low-fat (lean) proteins, complex carbohydrates, fresh fruits and vegetables, low-fat dairy products, healthy fats, and fiber.  Cutting down on calories.  Exercising regularly. What are tips for following this plan?  Follow a balanced diet for meals and snacks. Eat breakfast, lunch, dinner, and one or two snacks every day.  Include protein in each meal and snack.  Choose whole grains instead of products that are made with refined flour.  Eat a variety of foods.  Exercise regularly as told by your health care provider. Aim to do at least 30 minutes of exercise on most days of the week.  If you are overweight or obese: ? Pay attention to how many calories you eat. Cutting down on calories can help you lose weight. ? Work with your health care provider or a dietitian to figure out how many calories you need each day. What foods should I eat? Fruits Include a variety of colors and types. All fruits are helpful for PCOS. Vegetables Include a variety of colors and types. All vegetables are helpful for PCOS. Grains Whole grains, such as whole wheat. Whole-grain breads, crackers, cereals, and pasta. Unsweetened oatmeal. Bulgur, barley, quinoa, and brown rice. Tortillas made from corn or  whole-wheat flour. Meats and other proteins Lean proteins, such as fish, chicken, beans, eggs, and tofu. Dairy Low-fat dairy products, such as skim milk, cheese sticks, and yogurt. Beverages Low-fat or fat-free drinks, such as water, low-fat milk, sugar-free drinks, and small amounts of 100% fruit juice. Seasonings and condiments Ketchup. Mustard. Barbecue sauce. Relish. Low-fat or fat-free mayonnaise. Fats and oils Olive oil or canola oil. Walnuts and almonds. The items listed above may not be a complete list of recommended foods and beverages. Contact a dietitian for more options.   What foods should I avoid? Foods that are high in calories or fat, especially saturated or trans fats. Fried foods. Sweets. Products that are made from refined white flour, including white bread, pastries, white rice, and pasta. The items listed above may not be a complete list of foods and beverages to avoid. Contact a dietitian for more information. Summary  PCOS is a hormonal imbalance that affects a woman's reproductive system. It can cause problems with menstrual periods and make it hard to get and stay pregnant.  You can help to manage your PCOS by exercising regularly and eating a healthy, varied diet of vegetables, fruit, whole grains, lean protein, and low-fat dairy products.  Changing what you eat can improve the way that your body uses insulin, help your hormones reach normal levels, and help you lose weight. This information is not intended to replace advice given to you by your health care provider. Make sure you discuss any questions you have with your health care provider. Document Revised: 02/29/2020 Document Reviewed: 02/29/2020 Elsevier Patient Education  2021 Elsevier  Inc. Insulin Resistance  Insulin is a hormone that is made by the pancreas. Insulin allows blood sugar (glucose) to enter the cells in the body. Insulin helps the body use glucose for energy. Normally, the body is insulin  sensitive, which means the cells in the body are effective at absorbing glucose. Insulin resistance is when the cells in the body do not respond properly to insulin and are not able to absorb glucose. The pancreas makes more insulin, but over time the body cannot make enough insulin to keep glucose at normal levels. Insulin resistance results in high blood glucose levels (hyperglycemia) and can lead to problems, including:  Prediabetes.  Type 2 diabetes (diabetes mellitus).  Heart disease.  High blood pressure (hypertension).  Stroke.  Polycystic ovary syndrome (PCOS).  Nonalcoholic fatty liver disease. What are the causes? The exact cause of insulin resistance is not known. What increases the risk? The following factors may make you more likely to develop insulin resistance:  Being overweight or obese, especially if a lot of your weight is in your waist area.  Having an inactive (sedentary) lifestyle.  Having above-normal glucose levels.  Having abnormal cholesterol levels.  Having sleep apnea.  Being older than age 63.  Using steroids. What are the signs or symptoms? This condition usually does not cause symptoms. A waist measurement of more than 35 inches (88.9 cm) for women and more than 40 inches (101.6 cm) for men may be a sign of insulin resistance. How is this diagnosed? There is no test to diagnose insulin resistance. However, your health care provider may diagnose insulin resistance based on:  A physical exam.  Your medical history.  Blood tests that check your blood glucose level. How is this treated? Insulin resistance is treated with nutrition and lifestyle changes. These changes may include:  Eating a healthy balance of nutritious foods.  Getting more physical activity.  Maintaining a healthy weight.  Stopping the use of any tobacco products. Your health care provider will work with you to change your nutrition and lifestyle as needed. In some  cases, treatment may also include medicine to improve your insulin sensitivity. Follow these instructions at home: Activity  Be physically active. Do moderate-intensity physical activity for at least 30 minutes on 5 or more days of the week, or as told by your health care provider. This could include brisk walking, biking, or water aerobics.  Ask your health care provider what activities are safe for you. A mix of physical activities may be best, such as walking, swimming, biking, and strength training. Eating and drinking  Follow a healthy meal plan. This includes eating: ? Lean proteins. ? Complex carbohydrates. Examples of these include whole grains, starchy vegetables (potatoes, corn, peas), and beans. ? Fresh fruits and vegetables. ? Low-fat dairy products. ? Healthy fats.  Follow instructions from your health care provider about eating or drinking restrictions.  Make an appointment to see a diet and nutrition specialist (registered dietitian) to help you create a healthy eating plan.   General instructions  Check your blood glucose levels as told by your health care provider.  Take over-the-counter and prescription medicines only as told by your health care provider.  Lose weight as told by your health care provider. ? Losing 5-7% of your body weight can reverse insulin resistance. ? Your health care provider can determine how much weight loss is best for you and can help you lose weight safely.  Do not use any products that contain nicotine or  tobacco. These products include cigarettes, chewing tobacco, and vaping devices, such as e-cigarettes. If you need help quitting, ask your health care provider.  Keep all follow-up visits. This is important. Contact a health care provider if:  You have trouble losing weight or maintaining your goal weight.  You gain weight.  You have trouble following your prescribed meal plan.  You have trouble exercising  more. Summary  Insulin resistance occurs when cells in the body do not respond properly to insulin and are not able to absorb blood sugar (glucose). The body makes more insulin, but over time the body cannot make enough insulin to keep blood sugar at normal levels.  Insulin resistance is treated with nutrition and lifestyle changes, including eating a healthy balance of nutritious foods, getting more physical activity, and maintaining a healthy weight.  Your health care provider will work with you to change your nutrition and lifestyle as needed. Treatment may also include medicine to improve your insulin sensitivity.  Check your blood glucose levels as told by your health care provider.  Keep all follow-up visits. This is important. This information is not intended to replace advice given to you by your health care provider. Make sure you discuss any questions you have with your health care provider. Document Revised: 06/10/2020 Document Reviewed: 06/10/2020 Elsevier Patient Education  2021 Elsevier Inc. Semaglutide injection solution What is this medicine? SEMAGLUTIDE (Sem a GLOO tide) is used to improve blood sugar control in adults with type 2 diabetes. This medicine may be used with other diabetes medicines. This drug may also reduce the risk of heart attack or stroke if you have type 2 diabetes and risk factors for heart disease. This medicine may be used for other purposes; ask your health care provider or pharmacist if you have questions. COMMON BRAND NAME(S): OZEMPIC What should I tell my health care provider before I take this medicine? They need to know if you have any of these conditions:  endocrine tumors (MEN 2) or if someone in your family had these tumors  eye disease, vision problems  history of pancreatitis  kidney disease  stomach problems  thyroid cancer or if someone in your family had thyroid cancer  an unusual or allergic reaction to semaglutide, other  medicines, foods, dyes, or preservatives  pregnant or trying to get pregnant  breast-feeding How should I use this medicine? This medicine is for injection under the skin of your upper leg (thigh), stomach area, or upper arm. It is given once every week (every 7 days). You will be taught how to prepare and give this medicine. Use exactly as directed. Take your medicine at regular intervals. Do not take it more often than directed. If you use this medicine with insulin, you should inject this medicine and the insulin separately. Do not mix them together. Do not give the injections right next to each other. Change (rotate) injection sites with each injection. It is important that you put your used needles and syringes in a special sharps container. Do not put them in a trash can. If you do not have a sharps container, call your pharmacist or healthcare provider to get one. A special MedGuide will be given to you by the pharmacist with each prescription and refill. Be sure to read this information carefully each time. This drug comes with INSTRUCTIONS FOR USE. Ask your pharmacist for directions on how to use this drug. Read the information carefully. Talk to your pharmacist or health care provider if you have  questions. Talk to your pediatrician regarding the use of this medicine in children. Special care may be needed. Overdosage: If you think you have taken too much of this medicine contact a poison control center or emergency room at once. NOTE: This medicine is only for you. Do not share this medicine with others. What if I miss a dose? If you miss a dose, take it as soon as you can within 5 days after the missed dose. Then take your next dose at your regular weekly time. If it has been longer than 5 days after the missed dose, do not take the missed dose. Take the next dose at your regular time. Do not take double or extra doses. If you have questions about a missed dose, contact your health care  provider for advice. What may interact with this medicine?  other medicines for diabetes Many medications may cause changes in blood sugar, these include:  alcohol containing beverages  antiviral medicines for HIV or AIDS  aspirin and aspirin-like drugs  certain medicines for blood pressure, heart disease, irregular heart beat  chromium  diuretics  female hormones, such as estrogens or progestins, birth control pills  fenofibrate  gemfibrozil  isoniazid  lanreotide  female hormones or anabolic steroids  MAOIs like Carbex, Eldepryl, Marplan, Nardil, and Parnate  medicines for weight loss  medicines for allergies, asthma, cold, or cough  medicines for depression, anxiety, or psychotic disturbances  niacin  nicotine  NSAIDs, medicines for pain and inflammation, like ibuprofen or naproxen  octreotide  pasireotide  pentamidine  phenytoin  probenecid  quinolone antibiotics such as ciprofloxacin, levofloxacin, ofloxacin  some herbal dietary supplements  steroid medicines such as prednisone or cortisone  sulfamethoxazole; trimethoprim  thyroid hormones Some medications can hide the warning symptoms of low blood sugar (hypoglycemia). You may need to monitor your blood sugar more closely if you are taking one of these medications. These include:  beta-blockers, often used for high blood pressure or heart problems (examples include atenolol, metoprolol, propranolol)  clonidine  guanethidine  reserpine This list may not describe all possible interactions. Give your health care provider a list of all the medicines, herbs, non-prescription drugs, or dietary supplements you use. Also tell them if you smoke, drink alcohol, or use illegal drugs. Some items may interact with your medicine. What should I watch for while using this medicine? Visit your doctor or health care professional for regular checks on your progress. Drink plenty of fluids while taking this  medicine. Check with your doctor or health care professional if you get an attack of severe diarrhea, nausea, and vomiting. The loss of too much body fluid can make it dangerous for you to take this medicine. A test called the HbA1C (A1C) will be monitored. This is a simple blood test. It measures your blood sugar control over the last 2 to 3 months. You will receive this test every 3 to 6 months. Learn how to check your blood sugar. Learn the symptoms of low and high blood sugar and how to manage them. Always carry a quick-source of sugar with you in case you have symptoms of low blood sugar. Examples include hard sugar candy or glucose tablets. Make sure others know that you can choke if you eat or drink when you develop serious symptoms of low blood sugar, such as seizures or unconsciousness. They must get medical help at once. Tell your doctor or health care professional if you have high blood sugar. You might need to change  the dose of your medicine. If you are sick or exercising more than usual, you might need to change the dose of your medicine. Do not skip meals. Ask your doctor or health care professional if you should avoid alcohol. Many nonprescription cough and cold products contain sugar or alcohol. These can affect blood sugar. Pens should never be shared. Even if the needle is changed, sharing may result in passing of viruses like hepatitis or HIV. Wear a medical ID bracelet or chain, and carry a card that describes your disease and details of your medicine and dosage times. Do not become pregnant while taking this medicine. Women should inform their doctor if they wish to become pregnant or think they might be pregnant. There is a potential for serious side effects to an unborn child. Talk to your health care professional or pharmacist for more information. What side effects may I notice from receiving this medicine? Side effects that you should report to your doctor or health care  professional as soon as possible:  allergic reactions like skin rash, itching or hives, swelling of the face, lips, or tongue  breathing problems  changes in vision  diarrhea that continues or is severe  lump or swelling on the neck  severe nausea  signs and symptoms of infection like fever or chills; cough; sore throat; pain or trouble passing urine  signs and symptoms of low blood sugar such as feeling anxious, confusion, dizziness, increased hunger, unusually weak or tired, sweating, shakiness, cold, irritable, headache, blurred vision, fast heartbeat, loss of consciousness  signs and symptoms of kidney injury like trouble passing urine or change in the amount of urine  trouble swallowing  unusual stomach upset or pain  vomiting Side effects that usually do not require medical attention (report to your doctor or health care professional if they continue or are bothersome):  constipation  diarrhea  nausea  pain, redness, or irritation at site where injected  stomach upset This list may not describe all possible side effects. Call your doctor for medical advice about side effects. You may report side effects to FDA at 1-800-FDA-1088. Where should I keep my medicine? Keep out of the reach of children. Store unopened pens in a refrigerator between 2 and 8 degrees C (36 and 46 degrees F). Do not freeze. Protect from light and heat. After you first use the pen, it can be stored for 56 days at room temperature between 15 and 30 degrees C (59 and 86 degrees F) or in a refrigerator. Throw away your used pen after 56 days or after the expiration date, whichever comes first. Do not store your pen with the needle attached. If the needle is left on, medicine may leak from the pen. NOTE: This sheet is a summary. It may not cover all possible information. If you have questions about this medicine, talk to your doctor, pharmacist, or health care provider.  2021 Elsevier/Gold Standard  (2019-06-06 09:41:51)

## 2020-11-08 LAB — COMPREHENSIVE METABOLIC PANEL
ALT: 21 IU/L (ref 0–32)
AST: 20 IU/L (ref 0–40)
Albumin/Globulin Ratio: 1.4 (ref 1.2–2.2)
Albumin: 4.1 g/dL (ref 3.8–4.8)
Alkaline Phosphatase: 113 IU/L (ref 44–121)
BUN/Creatinine Ratio: 10 (ref 9–23)
BUN: 8 mg/dL (ref 6–20)
Bilirubin Total: 0.4 mg/dL (ref 0.0–1.2)
CO2: 23 mmol/L (ref 20–29)
Calcium: 9.4 mg/dL (ref 8.7–10.2)
Chloride: 101 mmol/L (ref 96–106)
Creatinine, Ser: 0.8 mg/dL (ref 0.57–1.00)
GFR calc Af Amer: 113 mL/min/{1.73_m2} (ref >59)
GFR calc non Af Amer: 98 mL/min/{1.73_m2} (ref >59)
Globulin, Total: 2.9 g/dL (ref 1.5–4.5)
Glucose: 83 mg/dL (ref 65–99)
Potassium: 4.3 mmol/L (ref 3.5–5.2)
Sodium: 138 mmol/L (ref 134–144)
Total Protein: 7 g/dL (ref 6.0–8.5)

## 2020-11-08 LAB — CBC WITH DIFFERENTIAL/PLATELET
Basophils Absolute: 0.1 10*3/uL (ref 0.0–0.2)
Basos: 1 %
EOS (ABSOLUTE): 0.2 10*3/uL (ref 0.0–0.4)
Eos: 2 %
Hematocrit: 40.9 % (ref 34.0–46.6)
Hemoglobin: 13.7 g/dL (ref 11.1–15.9)
Immature Grans (Abs): 0 10*3/uL (ref 0.0–0.1)
Immature Granulocytes: 0 %
Lymphocytes Absolute: 2.6 10*3/uL (ref 0.7–3.1)
Lymphs: 33 %
MCH: 31.3 pg (ref 26.6–33.0)
MCHC: 33.5 g/dL (ref 31.5–35.7)
MCV: 93 fL (ref 79–97)
Monocytes Absolute: 0.6 10*3/uL (ref 0.1–0.9)
Monocytes: 7 %
Neutrophils Absolute: 4.4 10*3/uL (ref 1.4–7.0)
Neutrophils: 57 %
Platelets: 359 10*3/uL (ref 150–450)
RBC: 4.38 x10E6/uL (ref 3.77–5.28)
RDW: 12.2 % (ref 11.7–15.4)
WBC: 7.8 10*3/uL (ref 3.4–10.8)

## 2020-11-08 LAB — LIPID PANEL
Chol/HDL Ratio: 3.7 ratio (ref 0.0–4.4)
Cholesterol, Total: 161 mg/dL (ref 100–199)
HDL: 44 mg/dL (ref >39)
LDL Chol Calc (NIH): 100 mg/dL — ABNORMAL HIGH (ref 0–99)
Triglycerides: 91 mg/dL (ref 0–149)
VLDL Cholesterol Cal: 17 mg/dL (ref 5–40)

## 2020-11-08 LAB — TSH: TSH: 1.56 u[IU]/mL (ref 0.450–4.500)

## 2020-11-08 LAB — CARDIOVASCULAR RISK ASSESSMENT

## 2020-11-09 ENCOUNTER — Encounter: Payer: Self-pay | Admitting: Nurse Practitioner

## 2020-11-20 ENCOUNTER — Encounter: Payer: Self-pay | Admitting: Nurse Practitioner

## 2020-11-20 ENCOUNTER — Telehealth (INDEPENDENT_AMBULATORY_CARE_PROVIDER_SITE_OTHER): Payer: 59 | Admitting: Nurse Practitioner

## 2020-11-20 VITALS — Ht 66.0 in | Wt 268.0 lb

## 2020-11-20 DIAGNOSIS — R1013 Epigastric pain: Secondary | ICD-10-CM

## 2020-11-20 DIAGNOSIS — R197 Diarrhea, unspecified: Secondary | ICD-10-CM

## 2020-11-20 DIAGNOSIS — R112 Nausea with vomiting, unspecified: Secondary | ICD-10-CM | POA: Diagnosis not present

## 2020-11-20 DIAGNOSIS — R10816 Epigastric abdominal tenderness: Secondary | ICD-10-CM | POA: Diagnosis not present

## 2020-11-20 NOTE — Progress Notes (Signed)
Virtual Visit via Video Note   This visit type was conducted due to national recommendations for restrictions regarding the COVID-19 Pandemic (e.g. social distancing) in an effort to limit this patient's exposure and mitigate transmission in our community.  Due to her co-morbid illnesses, this patient is at least at moderate risk for complications without adequate follow up.  This format is felt to be most appropriate for this patient at this time.  All issues noted in this document were discussed and addressed.  A limited physical exam was performed with this format.  A verbal consent was obtained for the virtual visit.   Date:  11/20/2020   ID:  Megan Barrera, DOB April 20, 1986, MRN [id number]  Patient Location: Home Provider Location: Office/Clinic  PCP:  Megan Morning, NP   Evaluation Performed:  Established patient, acute telemedicine visit  Chief Complaint:  Diarrhea, abdominal pain  History of Present Illness:    Megan Barrera is a 35 y.o. female with chills, abdominal pain, and diarrhea. Onset of symptoms was 6-days agoCarrie tells me she had Asian food from a restaurant on 11/17/20. That evening she experienced nausea, vomiting, diarrhea, and abdominal pain. Treatment has included bland diet, pushing fluids, and Imodium. Symptoms improved initially. Two days ago symptoms returned after chicken nuggets from [employer]. She tells me she has experienced chronic belching, sour taste in mouth, and bloating. She states she had a RUQ [place] and [place] Scan in 2017-2018.Two home COVID-19 tests  were negative since during the past week.  The patient does have symptoms concerning for COVID-19 infection (fever, chills, cough, or new shortness of breath).    Past Medical History:  Diagnosis Date  . Asthma   . DDD (degenerative disc disease), lumbar     Past Surgical History:  Procedure Laterality Date  . BACK SURGERY  11/21/2017   L4-L5 Dr Danielle Dess ruptured disc repair, denies hardware  implanted    Family History  Problem Relation Age of Onset  . Cirrhosis Father   . Alcohol abuse Father   . Hypertension Father   . Breast cancer Paternal Aunt   . Diabetes Maternal Grandmother   . Hypertension Maternal Grandmother     Social History   Socioeconomic History  . Marital status: Single    Spouse name: Not on file  . Number of children: Not on file  . Years of education: Not on file  . Highest education level: Not on file  Occupational History  . Not on file  Tobacco Use  . Smoking status: Never Smoker  . Smokeless tobacco: Never Used  Substance and Sexual Activity  . Alcohol use: Yes    Comment: occasionally  . Drug use: No  . Sexual activity: Yes    Birth control/protection: None  Other Topics Concern  . Not on file  Social History Narrative  . Not on file   Social Determinants of Health   Financial Resource Strain: Not on file  Food Insecurity: Not on file  Transportation Needs: Not on file  Physical Activity: Not on file  Stress: Not on file  Social Connections: Not on file  Intimate Partner Violence: Not on file    Outpatient Medications Prior to Visit  Medication Sig Dispense Refill  . doxycycline (VIBRAMYCIN) 100 MG capsule Take 1 capsule (100 mg total) by mouth 2 (two) times daily. 28 capsule 0  . Liraglutide -Weight Management (SAXENDA) 18 MG/3ML SOPN Inject 3 mg into the skin daily. 0.6 mg once daily [place]  Week  1; increase to 1.2 mg once daily [place] week 2; increase to 1.8 mg once daily [place] week 3; increase to 2.4 mg daily [place] week 4; increase to 3 mg daily [place] week 5 9 mL 0   No facility-administered medications prior to visit.    Allergies:   Meloxicam, Other, Tetanus toxoids, and Nitrofurantoin   Social History   Tobacco Use  . Smoking status: Never Smoker  . Smokeless tobacco: Never Used  Substance Use Topics  . Alcohol use: Yes    Comment: occasionally  . Drug use: No     Review of Systems  Constitutional: Positive for  malaise/fatigue. Negative for chills, fever and weight loss.  HENT: Negative for congestion, ear pain and sore throat.   Respiratory: Negative for cough and shortness of breath.   Cardiovascular: Negative for chest pain and palpitations.  Gastrointestinal: Positive for abdominal pain (LUQ), diarrhea, heartburn, nausea and vomiting. Negative for blood in stool and constipation.       Bloating, belching, and GERD  Genitourinary: Negative for dysuria, frequency and urgency.  Musculoskeletal: Negative for back pain, joint pain, myalgias and neck pain.  Neurological: Negative for dizziness and headaches.     Labs/Other Tests and Data Reviewed:    Recent Labs: 11/07/2020: ALT 21; BUN 8; Creatinine, Ser 0.80; Hemoglobin 13.7; Platelets 359; Potassium 4.3; Sodium 138; TSH 1.560   Recent Lipid Panel Lab Results  Component Value Date/Time   CHOL 161 11/07/2020 03:04 PM   TRIG 91 11/07/2020 03:04 PM   HDL 44 11/07/2020 03:04 PM   CHOLHDL 3.7 11/07/2020 03:04 PM   LDLCALC 100 (H) 11/07/2020 03:04 PM    Wt Readings from Last 3 Encounters:  11/20/20 268 lb (121.6 kg)  11/07/20 268 lb (121.6 kg)  02/24/18 259 lb (117.5 kg)     Objective:    Vital Signs:  Ht 5\' 6"  (1.676 m)   Wt 268 lb (121.6 kg)   BMI 43.26 kg/m    Physical Exam Vitals reviewed.  Neurological:     Mental Status: She is alert.      ASSESSMENT & PLAN:    1. Epigastric abdominal tenderness without rebound tenderness - CBC with Differential/Platelet - Comprehensive metabolic panel - Abdomen Limited RUQ (LIVER/GB)  2. Diarrhea, unspecified type  3. Non-intractable vomiting with nausea, unspecified vomiting type  4. Dyspepsia  Low-fat gallbladder eating plan RUQ [place] on Friday, Feb 18th, 2022 @ 9am [facility] CBC and CMP tomorrow am Follow-up pending labs and Feb 20th, 2022 report Seek emergency medical care for severe pain  COVID-19 Education: The signs and symptoms of COVID-19 were discussed with the patient  and how to seek care for testing (follow up with PCP or arrange E-visit). The importance of social distancing was discussed today.   I spent 15 minutes dedicated to the care of this patient on the date of this encounter to include face-to-face time with the patient, as well as: EMR review.  Follow Up:  In Person prn  Signed, [place], DNP  11/20/2020 4:03 PM    [facility]

## 2020-11-21 ENCOUNTER — Other Ambulatory Visit: Payer: 59

## 2020-11-21 ENCOUNTER — Other Ambulatory Visit: Payer: Self-pay

## 2020-11-21 LAB — COMPREHENSIVE METABOLIC PANEL
ALT: 86 IU/L — ABNORMAL HIGH (ref 0–32)
AST: 72 IU/L — ABNORMAL HIGH (ref 0–40)
Albumin/Globulin Ratio: 1.5 (ref 1.2–2.2)
Albumin: 4.1 g/dL (ref 3.8–4.8)
Alkaline Phosphatase: 113 IU/L (ref 44–121)
BUN/Creatinine Ratio: 10 (ref 9–23)
BUN: 8 mg/dL (ref 6–20)
Bilirubin Total: 0.4 mg/dL (ref 0.0–1.2)
CO2: 21 mmol/L (ref 20–29)
Calcium: 8.8 mg/dL (ref 8.7–10.2)
Chloride: 102 mmol/L (ref 96–106)
Creatinine, Ser: 0.81 mg/dL (ref 0.57–1.00)
GFR calc Af Amer: 111 mL/min/{1.73_m2} (ref >59)
GFR calc non Af Amer: 96 mL/min/{1.73_m2} (ref >59)
Globulin, Total: 2.7 g/dL (ref 1.5–4.5)
Glucose: 90 mg/dL (ref 65–99)
Potassium: 3.8 mmol/L (ref 3.5–5.2)
Sodium: 138 mmol/L (ref 134–144)
Total Protein: 6.8 g/dL (ref 6.0–8.5)

## 2020-11-21 LAB — CBC WITH DIFFERENTIAL/PLATELET
Basophils Absolute: 0.1 10*3/uL (ref 0.0–0.2)
Basos: 1 %
EOS (ABSOLUTE): 0.2 10*3/uL (ref 0.0–0.4)
Eos: 3 %
Hematocrit: 41.1 % (ref 34.0–46.6)
Hemoglobin: 13.3 g/dL (ref 11.1–15.9)
Immature Grans (Abs): 0 10*3/uL (ref 0.0–0.1)
Immature Granulocytes: 0 %
Lymphocytes Absolute: 2.4 10*3/uL (ref 0.7–3.1)
Lymphs: 38 %
MCH: 29.8 pg (ref 26.6–33.0)
MCHC: 32.4 g/dL (ref 31.5–35.7)
MCV: 92 fL (ref 79–97)
Monocytes Absolute: 0.5 10*3/uL (ref 0.1–0.9)
Monocytes: 9 %
Neutrophils Absolute: 3.1 10*3/uL (ref 1.4–7.0)
Neutrophils: 49 %
Platelets: 331 10*3/uL (ref 150–450)
RBC: 4.47 x10E6/uL (ref 3.77–5.28)
RDW: 12.2 % (ref 11.7–15.4)
WBC: 6.3 10*3/uL (ref 3.4–10.8)

## 2020-11-22 ENCOUNTER — Ambulatory Visit
Admission: RE | Admit: 2020-11-22 | Discharge: 2020-11-22 | Disposition: A | Discharge status: Home / Self Care | Payer: Managed Care, Other (non HMO) | Source: Ambulatory Visit | Attending: Nurse Practitioner | Admitting: Nurse Practitioner

## 2020-11-25 ENCOUNTER — Other Ambulatory Visit: Payer: Self-pay | Admitting: Nurse Practitioner

## 2020-11-25 DIAGNOSIS — R10816 Epigastric abdominal tenderness: Secondary | ICD-10-CM

## 2020-11-26 ENCOUNTER — Encounter: Payer: Self-pay | Admitting: Nurse Practitioner

## 2020-11-26 ENCOUNTER — Telehealth (INDEPENDENT_AMBULATORY_CARE_PROVIDER_SITE_OTHER): Payer: 59 | Admitting: Nurse Practitioner

## 2020-11-26 VITALS — Ht 66.0 in | Wt 268.0 lb

## 2020-11-26 DIAGNOSIS — R112 Nausea with vomiting, unspecified: Secondary | ICD-10-CM

## 2020-11-26 DIAGNOSIS — Z6841 Body Mass Index (BMI) 40.0 and over, adult: Secondary | ICD-10-CM

## 2020-11-26 DIAGNOSIS — K219 Gastro-esophageal reflux disease without esophagitis: Secondary | ICD-10-CM

## 2020-11-26 DIAGNOSIS — K760 Fatty (change of) liver, not elsewhere classified: Secondary | ICD-10-CM

## 2020-11-26 DIAGNOSIS — Z833 Family history of diabetes mellitus: Secondary | ICD-10-CM | POA: Diagnosis not present

## 2020-11-26 DIAGNOSIS — R748 Abnormal levels of other serum enzymes: Secondary | ICD-10-CM

## 2020-11-26 MED ORDER — SEMAGLUTIDE(0.25 OR 0.5MG/DOS) 2 MG/1.5ML ~~LOC~~ SOPN: PEN_INJECTOR | SUBCUTANEOUS | 0 refills | Status: AC

## 2020-11-26 MED ORDER — OMEPRAZOLE 20 MG PO CPDR: 20.0000 mg | DELAYED_RELEASE_CAPSULE | Freq: Every day | ORAL | 1 refills | Status: AC

## 2020-11-26 MED ORDER — ONDANSETRON 4 MG PO TBDP: 4.0000 mg | ORAL_TABLET | Freq: Three times a day (TID) | ORAL | 0 refills | Status: AC | PRN

## 2020-11-26 NOTE — Progress Notes (Signed)
Virtual Visit via Video Note   This visit type was conducted due to national recommendations for restrictions regarding the COVID-19 Pandemic (e.g. social distancing) in an effort to limit this patient's exposure and mitigate transmission in our community.  Due to her co-morbid illnesses, this patient is at least at moderate risk for complications without adequate follow up.  This format is felt to be most appropriate for this patient at this time.  All issues noted in this document were discussed and addressed.  A limited physical exam was performed with this format.  A verbal consent was obtained for the virtual visit.   Date:  11/26/2020   ID:  Megan Barrera, Megan Barrera 1986-01-21, MRN [id number]  Patient Location: Home Provider Location: Office/Clinic  PCP:  Janie Morning, NP   Evaluation Performed:  Follow-Up Visit, established patient  Chief Complaint:  Abd pain/discuss labs  History of Present Illness:    Megan Barrera is a 35 y.o. female with intermittent abdominal pain,nausea, vomiting, bloating, belching, and diarrhea. Onset was approximately 2-weeks ago Pt has had an evaluation for cholecystitis in the past.RUQ US revealed hepatic steatosis, no gallstones visible. HIDA scan ordered to assess gallbladder function, awaiting schedule date. Pt tells me she had previous gallbladder workup in 2017. HIDA scan from 2017 revealed EF 87%.  Megan Barrera tells me her health insurance did not cover semaglutide that was prescribed for weight loss. BMI 43.26. Pt states she is concerned with newly diagnosed hepatic steatosis. Liver enzymes normal per CMP on 11/21/20. She has attempted various diets and exercise programs without successful weight-loss. She is concerned with developing diabetes mellitus due to family history and hypertension related to obesity. She has requested pharmacotherapy to assist weight loss in addition to nutrition, physical activity, and behavior modification.   The patient  does not have symptoms concerning for COVID-19 infection (fever, chills, cough, or new shortness of breath).    Past Medical History:  Diagnosis Date  . Asthma   . DDD (degenerative disc disease), lumbar     Past Surgical History:  Procedure Laterality Date  . BACK SURGERY  11/21/2017   L4-L5 Dr Danielle Dess ruptured disc repair, denies hardware implanted    Family History  Problem Relation Age of Onset  . Cirrhosis Father   . Alcohol abuse Father   . Hypertension Father   . Breast cancer Paternal Aunt   . Diabetes Maternal Grandmother   . Hypertension Maternal Grandmother     Social History   Socioeconomic History  . Marital status: Single    Spouse name: Not on file  . Number of children: Not on file  . Years of education: Not on file  . Highest education level: Not on file  Occupational History  . Not on file  Tobacco Use  . Smoking status: Never Smoker  . Smokeless tobacco: Never Used  Substance and Sexual Activity  . Alcohol use: Yes    Comment: occasionally  . Drug use: No  . Sexual activity: Yes    Birth control/protection: None  Other Topics Concern  . Not on file  Social History Narrative  . Not on file   Social Determinants of Health   Financial Resource Strain: Not on file  Food Insecurity: Not on file  Transportation Needs: Not on file  Physical Activity: Not on file  Stress: Not on file  Social Connections: Not on file  Intimate Partner Violence: Not on file    Outpatient Medications Prior to Visit  Medication Sig  Dispense Refill  . doxycycline (VIBRAMYCIN) 100 MG capsule Take 1 capsule (100 mg total) by mouth 2 (two) times daily. 28 capsule 0  . Liraglutide -Weight Management (SAXENDA) 18 MG/3ML SOPN Inject 3 mg into the skin daily. 0.6 mg once daily [place]  Week 1; increase to 1.2 mg once daily [place] week 2; increase to 1.8 mg once daily [place] week 3; increase to 2.4 mg daily [place] week 4; increase to 3 mg daily [place] week 5 9 mL 0   No facility-administered  medications prior to visit.    Allergies:   Meloxicam, Other, Tetanus toxoids, and Nitrofurantoin   Social History   Tobacco Use  . Smoking status: Never Smoker  . Smokeless tobacco: Never Used  Substance Use Topics  . Alcohol use: Yes    Comment: occasionally  . Drug use: No     Review of Systems  Constitutional: Negative for fever.  HENT: Negative for congestion and sore throat.   Respiratory: Negative for cough, shortness of breath and wheezing.   Cardiovascular: Negative for chest pain, palpitations and leg swelling.  Gastrointestinal: Positive for abdominal pain (RUQ), diarrhea, heartburn, nausea and vomiting.  Genitourinary: Negative for dysuria, frequency and urgency.  Musculoskeletal: Negative for joint pain and myalgias.  Skin: Negative for rash.  Neurological: Negative for dizziness and headaches.     Labs/Other Tests and Data Reviewed:    Recent Labs: 11/07/2020: TSH 1.560 11/21/2020: ALT 86; BUN 8; Creatinine, Ser 0.81; Hemoglobin 13.3; Platelets 331; Potassium 3.8; Sodium 138   Recent Lipid Panel Lab Results  Component Value Date/Time   CHOL 161 11/07/2020 03:04 PM   TRIG 91 11/07/2020 03:04 PM   HDL 44 11/07/2020 03:04 PM   CHOLHDL 3.7 11/07/2020 03:04 PM   LDLCALC 100 (H) 11/07/2020 03:04 PM    Wt Readings from Last 3 Encounters:  11/26/20 268 lb (121.6 kg)  11/20/20 268 lb (121.6 kg)  11/07/20 268 lb (121.6 kg)     Objective:    Vital Signs:  Ht 5\' 6"  (1.676 m)   Wt 268 lb (121.6 kg)   BMI 43.26 kg/m    Physical Exam No physical exam performed due to telemedicine visit  ASSESSMENT & PLAN:    1. Hepatic steatosis - Semaglutide,0.25 or 0.5MG /DOS, 2 MG/1.5ML SOPN; Inject 0.25 mg into the skin once a week for 30 days, THEN 0.5 mg once a week for 30 days, THEN 1 mg once a week.  Dispense: 5.25 mL; Refill: 0  2. Adult BMI 40.0-44.9 kg/sq m (HCC) - Semaglutide,0.25 or 0.5MG /DOS, 2 MG/1.5ML SOPN; Inject 0.25 mg into the skin once a week for 30  days, THEN 0.5 mg once a week for 30 days, THEN 1 mg once a week.  Dispense: 5.25 mL; Refill: 0  3. Class 3 severe obesity with serious comorbidity and body mass index (BMI) of 40.0 to 44.9 in adult, unspecified obesity type (HCC) - Semaglutide,0.25 or 0.5MG /DOS, 2 MG/1.5ML SOPN; Inject 0.25 mg into the skin once a week for 30 days, THEN 0.5 mg once a week for 30 days, THEN 1 mg once a week.  Dispense: 5.25 mL; Refill: 0  4. Family history of diabetes mellitus -Heart healthy low-fat, low-carb diet -Increase physical activity    COVID-19 Education: The signs and symptoms of COVID-19 were discussed with the patient and how to seek care for testing (follow up with PCP or arrange E-visit). The importance of social distancing was discussed today.   I spent 15 minutes dedicated to the  care of this patient on the date of this encounter to include telephone time with the patient, as well as:  EMR review and prescription medication management.  Follow Up:  In Person prn  Signed, Flonnie Hailstone, DNP  11/27/2020 12:02 PM    [facility] [place]

## 2020-12-04 ENCOUNTER — Other Ambulatory Visit: Payer: Self-pay

## 2020-12-04 DIAGNOSIS — K828 Other specified diseases of gallbladder: Secondary | ICD-10-CM

## 2020-12-27 ENCOUNTER — Ambulatory Visit: Payer: Managed Care, Other (non HMO) | Admitting: Nurse Practitioner

## 2021-01-14 ENCOUNTER — Ambulatory Visit: Payer: Self-pay | Admitting: Surgery

## 2021-01-15 ENCOUNTER — Encounter: Payer: Self-pay | Admitting: Nurse Practitioner

## 2021-01-15 ENCOUNTER — Ambulatory Visit (INDEPENDENT_AMBULATORY_CARE_PROVIDER_SITE_OTHER): Payer: 59 | Admitting: Nurse Practitioner

## 2021-01-15 ENCOUNTER — Other Ambulatory Visit: Payer: Self-pay

## 2021-01-15 VITALS — BP 140/92 | HR 66 | Temp 97.7°F | Ht 66.0 in | Wt 276.0 lb

## 2021-01-15 DIAGNOSIS — H6500 Acute serous otitis media, unspecified ear: Secondary | ICD-10-CM | POA: Diagnosis not present

## 2021-01-15 DIAGNOSIS — J3089 Other allergic rhinitis: Secondary | ICD-10-CM

## 2021-01-15 DIAGNOSIS — Z8619 Personal history of other infectious and parasitic diseases: Secondary | ICD-10-CM

## 2021-01-15 MED ORDER — FLUCONAZOLE 150 MG PO TABS: 150.0000 mg | ORAL_TABLET | Freq: Once | ORAL | 0 refills | Status: AC

## 2021-01-15 MED ORDER — FLUTICASONE PROPIONATE 50 MCG/ACT NA SUSP
2.0000 | Freq: Every day | NASAL | 6 refills | Status: AC
Start: 2021-01-15 — End: ?

## 2021-01-15 MED ORDER — AMOXICILLIN 875 MG PO TABS: 875.0000 mg | ORAL_TABLET | Freq: Two times a day (BID) | ORAL | 0 refills | Status: DC

## 2021-01-15 NOTE — Progress Notes (Signed)
Acute Office Visit  Subjective:    Patient ID: Megan Barrera, female    DOB: 28-Mar-1986, 35 y.o.   MRN: [id number]  Chief Complaint  Patient presents with  . Right ear pressure    HPI Patient is in today for right ear otalgia. States symptoms began two-days ago, worsened yesterday. She tells me she has experienced nasal congestion, fullness and popping to bilateral ears, right worse than left. She denies any treatments for ear pain specifically. She does have a history of allergic rhinitis. Treated with Claritin or Allegra OTC.   Past Medical History:  Diagnosis Date  . Asthma   . DDD (degenerative disc disease), lumbar     Past Surgical History:  Procedure Laterality Date  . BACK SURGERY  11/21/2017   L4-L5 Dr Danielle Dess ruptured disc repair, denies hardware implanted    Family History  Problem Relation Age of Onset  . Cirrhosis Father   . Alcohol abuse Father   . Hypertension Father   . Breast cancer Paternal Aunt   . Diabetes Maternal Grandmother   . Hypertension Maternal Grandmother     Social History   Socioeconomic History  . Marital status: Single    Spouse name: Not on file  . Number of children: Not on file  . Years of education: Not on file  . Highest education level: Not on file  Occupational History  . Not on file  Tobacco Use  . Smoking status: Never Smoker  . Smokeless tobacco: Never Used  Substance and Sexual Activity  . Alcohol use: Yes    Comment: occasionally  . Drug use: No  . Sexual activity: Yes    Birth control/protection: None  Other Topics Concern  . Not on file  Social History Narrative  . Not on file      Outpatient Medications Prior to Visit  Medication Sig Dispense Refill  . omeprazole (PRILOSEC) 20 MG capsule Take 1 capsule (20 mg total) by mouth daily. 90 capsule 1  . ondansetron (ZOFRAN ODT) 4 MG disintegrating tablet Take 1 tablet (4 mg total) by mouth every 8 (eight) hours as needed for nausea or vomiting. 33 tablet 0   . Semaglutide,0.25 or 0.5MG /DOS, 2 MG/1.5ML SOPN Inject 0.25 mg into the skin once a week for 30 days, THEN 0.5 mg once a week for 30 days, THEN 1 mg once a week. 5.25 mL 0  . doxycycline (VIBRAMYCIN) 100 MG capsule Take 1 capsule (100 mg total) by mouth 2 (two) times daily. 28 capsule 0   No facility-administered medications prior to visit.    Allergies  Allergen Reactions  . Meloxicam Hives and Itching  . Other   . Tetanus Toxoids Hives and Swelling  . Nitrofurantoin Itching    Review of Systems  Constitutional: Positive for fatigue.  HENT: Positive for congestion and ear pain (bilateral, right worse than left). Negative for rhinorrhea, sinus pressure, sinus pain, sneezing, sore throat, trouble swallowing and voice change.        Bilateral ear fullness, popping  Eyes: Negative.   Respiratory: Negative.   Cardiovascular: Negative for chest pain and palpitations.  Gastrointestinal: Negative.   Endocrine: Negative.   Genitourinary: Negative.   Allergic/Immunologic: Positive for environmental allergies.  Neurological: Negative.   Hematological: Negative.   Psychiatric/Behavioral: Negative.        Objective:    Physical Exam Vitals reviewed.  Constitutional:      Appearance: Normal appearance.  HENT:     Head: Normocephalic.  Right Ear: Tenderness present. A middle ear effusion is present. Tympanic membrane is erythematous.     Left Ear: Tympanic membrane is injected and erythematous.     Nose: Congestion present.     Mouth/Throat:     Mouth: Mucous membranes are moist.  Cardiovascular:     Rate and Rhythm: Normal rate and regular rhythm.     Pulses: Normal pulses.     Heart sounds: Normal heart sounds.  Pulmonary:     Effort: Pulmonary effort is normal.     Breath sounds: Normal breath sounds.  Abdominal:     General: Bowel sounds are normal.     Palpations: Abdomen is soft.  Skin:    General: Skin is warm and dry.     Capillary Refill: Capillary refill  takes less than 2 seconds.  Neurological:     General: No focal deficit present.     Mental Status: She is alert and oriented to person, place, and time.  Psychiatric:        Mood and Affect: Mood normal.        Behavior: Behavior normal.     BP (!) 140/92 (BP Location: Right Arm, Patient Position: Sitting)   Pulse 66   Temp 97.7 F (36.5 C) (Temporal)   Ht 5\' 6"  (1.676 m)   Wt 276 lb (125.2 kg)   SpO2 100%   BMI 44.55 kg/m  Wt Readings from Last 3 Encounters:  01/15/21 276 lb (125.2 kg)  11/26/20 268 lb (121.6 kg)  11/20/20 268 lb (121.6 kg)    Health Maintenance Due  Topic Date Due  . Hepatitis C Screening  Never done  . COVID-19 Vaccine (1) Never done  . HIV Screening  Never done  . TETANUS/TDAP  Never done  . PAP SMEAR-Modifier  Never done     Lab Results  Component Value Date   TSH 1.560 11/07/2020   Lab Results  Component Value Date   WBC 6.3 11/21/2020   HGB 13.3 11/21/2020   HCT 41.1 11/21/2020   MCV 92 11/21/2020   PLT 331 11/21/2020   Lab Results  Component Value Date   NA 138 11/21/2020   K 3.8 11/21/2020   CO2 21 11/21/2020   GLUCOSE 90 11/21/2020   BUN 8 11/21/2020   CREATININE 0.81 11/21/2020   BILITOT 0.4 11/21/2020   ALKPHOS 113 11/21/2020   AST 72 (H) 11/21/2020   ALT 86 (H) 11/21/2020   PROT 6.8 11/21/2020   ALBUMIN 4.1 11/21/2020   CALCIUM 8.8 11/21/2020   ANIONGAP 6 02/24/2018   Lab Results  Component Value Date   CHOL 161 11/07/2020   Lab Results  Component Value Date   HDL 44 11/07/2020   Lab Results  Component Value Date   LDLCALC 100 (H) 11/07/2020   Lab Results  Component Value Date   TRIG 91 11/07/2020   Lab Results  Component Value Date   CHOLHDL 3.7 11/07/2020        Assessment & Plan:    1. Non-seasonal allergic rhinitis due to other allergic trigger - fluticasone (FLONASE) 50 MCG/ACT nasal spray; Place 2 sprays into both nostrils daily.  Dispense: 16 g; Refill: 6  2. Acute serous otitis media,  recurrence not specified, unspecified laterality - amoxicillin (AMOXIL) 875 MG tablet; Take 1 tablet (875 mg total) by mouth 2 (two) times daily.  Dispense: 10 tablet; Refill: 0  3. History of candidiasis - fluconazole (DIFLUCAN) 150 MG tablet; Take 1 tablet (150 mg total)  by mouth once for 1 dose.  Dispense: 1 tablet; Refill: 0     Begin Flonase nasal spray daily Take Amoxicillin 875 mg twice daily for 5 days Take Diflucan 150 mg after completing antibiotic Follow up as needed  Follow-up: As needed  Signed, Janie Morning, NP

## 2021-01-15 NOTE — Patient Instructions (Signed)
Begin Flonase nasal spray daily Take Amoxicillin 875 mg twice daily for 5 days Take Diflucan 150 mg after completing antibiotic Follow up as needed  Allergic Rhinitis, Adult Allergic rhinitis is a reaction to allergens. Allergens are things that can cause an allergic reaction. This condition affects the lining inside the nose (mucous membrane). There are two types of allergic rhinitis:  Seasonal. This type is also called hay fever. It happens only during some times of the year.  Perennial. This type can happen at any time of the year. This condition cannot be spread from person to person (is not contagious). It can be mild, worse, or very bad. It can develop at any age and may be outgrown. What are the causes? This condition may be caused by:  Pollen from grasses, trees, and weeds.  Dust mites.  Smoke.  Mold.  Car fumes.  The pee (urine), spit, or dander of pets. Dander is dead skin cells from a pet.   What increases the risk? You are more likely to develop this condition if:  You have allergies in your family.  You have problems like allergies in your family. You may have: ? Swelling of parts of your eyes and eyelids. ? Asthma. This affects how you breathe. ? Long-term redness and swelling on your skin. ? Food allergies. What are the signs or symptoms? The main symptom of this condition is a runny or stuffy nose (nasal congestion). Other symptoms may include:  Sneezing or coughing.  Itching and tearing of your eyes.  Mucus that drips down the back of your throat (postnasal drip).  Trouble sleeping.  Feeling tired.  Headache.  Sore throat. How is this treated? There is no cure for this condition. You should avoid things that you are allergic to. Treatment can help to relieve symptoms. This may include:  Medicines that block allergy symptoms, such as corticosteroids or antihistamines. These may be given as a shot, nasal spray, or pill.  Avoiding things you are  allergic to.  Medicines that give you bits of what you are allergic to over time. This is called immunotherapy. It is done if other treatments do not help. You may get: ? Shots. ? Medicine under your tongue.  Stronger medicines, if other treatments do not help. Follow these instructions at home: Avoiding allergens Find out what things you are allergic to and avoid them. To do this, try these things:  If you get allergies any time of year: ? Replace carpet with wood, tile, or vinyl flooring. Carpet can trap pet dander and dust. ? Do not smoke. Do not allow smoking in your home. ? Change your heating and air conditioning filters at least once a month.  If you get allergies only some times of the year: ? Keep windows closed when you can. ? Plan things to do outside when pollen counts are lowest. Check pollen counts before you plan things to do outside. ? When you come indoors, change your clothes and shower before you sit on furniture or bedding.   If you are allergic to a pet: ? Keep the pet out of your bedroom. ? Vacuum, sweep, and dust often.   General instructions  Take over-the-counter and prescription medicines only as told by your doctor.  Drink enough fluid to keep your pee (urine) pale yellow.  Keep all follow-up visits as told by your doctor. This is important. Where to find more information  American Academy of Allergy, Asthma & Immunology: www.aaaai.org Contact a doctor if:  You have a fever.  You get a cough that does not go away.  You make whistling sounds when you breathe (wheeze).  Your symptoms slow you down.  Your symptoms stop you from doing your normal things each day. Get help right away if:  You are short of breath. This symptom may be an emergency. Do not wait to see if the symptom will go away. Get medical help right away. Call your local emergency services (911 in the [place] Do not drive yourself to the hospital. Summary  Allergic rhinitis may  be treated by taking medicines and avoiding things you are allergic to.  If you have allergies only some of the year, keep windows closed when you can at those times.  Contact your doctor if you get a fever or a cough that does not go away. This information is not intended to replace advice given to you by your health care provider. Make sure you discuss any questions you have with your health care provider. Document Revised: 11/13/2019 Document Reviewed: 09/19/2019 Elsevier Patient Education  2021 [employer] Otitis Media, Adult  Otitis media is a condition in which the middle ear is red and swollen (inflamed) and full of fluid. The middle ear is the part of the ear that contains bones for hearing as well as air that helps send sounds to the brain. The condition usually goes away on its own. What are the causes? This condition is caused by a blockage in the eustachian tube. The eustachian tube connects the middle ear to the back of the nose. It normally allows air into the middle ear. The blockage is caused by fluid or swelling. Problems that can cause blockage include:  A cold or infection that affects the nose, mouth, or throat.  Allergies.  An irritant, such as tobacco smoke.  Adenoids that have become large. The adenoids are soft tissue located in the back of the throat, behind the nose and the roof of the mouth.  Growth or swelling in the upper part of the throat, just behind the nose (nasopharynx).  Damage to the ear caused by change in pressure. This is called barotrauma. What are the signs or symptoms? Symptoms of this condition include:  Ear pain.  Fever.  Problems with hearing.  Being tired.  Fluid leaking from the ear.  Ringing in the ear. How is this treated? This condition can go away on its own within 3-5 days. But if the condition is caused by bacteria or does not go away on its own, or if it keeps coming back, your doctor may:  Give you antibiotic  medicines.  Give you medicines for pain. Follow these instructions at home:  Take over-the-counter and prescription medicines only as told by your doctor.  If you were prescribed an antibiotic medicine, take it as told by your doctor. Do not stop taking the antibiotic even if you start to feel better.  Keep all follow-up visits as told by your doctor. This is important. Contact a doctor if:  You have bleeding from your nose.  There is a lump on your neck.  You are not feeling better in 5 days.  You feel worse instead of better. Get help right away if:  You have pain that is not helped with medicine.  You have swelling, redness, or pain around your ear.  You get a stiff neck.  You cannot move part of your face (paralysis).  You notice that the bone behind your ear hurts when  you touch it.  You get a very bad headache. Summary  Otitis media means that the middle ear is red, swollen, and full of fluid.  This condition usually goes away on its own.  If the problem does not go away, treatment may be needed. You may be given medicines to treat the infection or to treat your pain.  If you were prescribed an antibiotic medicine, take it as told by your doctor. Do not stop taking the antibiotic even if you start to feel better.  Keep all follow-up visits as told by your doctor. This is important. This information is not intended to replace advice given to you by your health care provider. Make sure you discuss any questions you have with your health care provider. Document Revised: 08/24/2019 Document Reviewed: 08/24/2019 Elsevier Patient Education  2021 [employer].

## 2021-02-06 ENCOUNTER — Ambulatory Visit: Payer: 59 | Admitting: Nurse Practitioner

## 2021-02-07 ENCOUNTER — Ambulatory Visit: Payer: 59 | Admitting: Nurse Practitioner

## 2021-02-11 ENCOUNTER — Ambulatory Visit (INDEPENDENT_AMBULATORY_CARE_PROVIDER_SITE_OTHER): Payer: 59 | Admitting: Nurse Practitioner

## 2021-02-11 ENCOUNTER — Other Ambulatory Visit: Payer: Self-pay

## 2021-02-11 ENCOUNTER — Encounter: Payer: Self-pay | Admitting: Nurse Practitioner

## 2021-02-11 VITALS — BP 128/88 | HR 66 | Temp 97.9°F | Ht 66.0 in | Wt 272.0 lb

## 2021-02-11 DIAGNOSIS — L989 Disorder of the skin and subcutaneous tissue, unspecified: Secondary | ICD-10-CM

## 2021-02-11 DIAGNOSIS — Z8619 Personal history of other infectious and parasitic diseases: Secondary | ICD-10-CM

## 2021-02-11 DIAGNOSIS — H6503 Acute serous otitis media, bilateral: Secondary | ICD-10-CM | POA: Diagnosis not present

## 2021-02-11 MED ORDER — AMOXICILLIN-POT CLAVULANATE 875-125 MG PO TABS: 1.0000 | ORAL_TABLET | Freq: Two times a day (BID) | ORAL | 0 refills | Status: DC

## 2021-02-11 MED ORDER — FLUCONAZOLE 150 MG PO TABS: 150.0000 mg | ORAL_TABLET | Freq: Once | ORAL | 0 refills | Status: AC

## 2021-02-11 MED ORDER — TRIAMCINOLONE ACETONIDE 40 MG/ML IJ SUSP
60.0000 mg | Freq: Once | INTRAMUSCULAR | Status: AC
  Administered 2021-02-11: 60 mg via INTRAMUSCULAR

## 2021-02-11 MED ORDER — CEFTRIAXONE SODIUM 1 G IJ SOLR
1.0000 g | Freq: Once | INTRAMUSCULAR | Status: AC
  Administered 2021-02-11: 1 g via INTRAMUSCULAR

## 2021-02-11 NOTE — Progress Notes (Signed)
Acute Office Visit  Subjective:    Patient ID: Megan Barrera, female    DOB: 1985/11/13, 35 y.o.   MRN: [id number]  Chief Complaint  Patient presents with  . EAR PAIN BILATERAL    HPI Patient is in today for bilateral ear pain. Onset of symptoms was approximately 3-weeks ago. She was seen in the office on 01/13/21. She was prescribed a course of Amoxicillin for otitis media. She was seen at Urgent Care on 02/06/21 and treated for bilateral otitis media with a course of Cefdinir. She states her symptoms have not improved. States pain to right ear is worse than left. She has upcoming laparoscopic cholecystectomy on 02/28/21 that has been postponed due to illness. She is concerned with having to cancel her gallbladder surgery a second time due to active infection.    Megan Barrera tells me she has a mid-back skin lesion that has been present for 2-3 years. States it is bothersome due to itching. She states she has seen a dermatologist previously that injected the lesion several months ago. She has requested a referral to a local dermatologist.   Past Medical History:  Diagnosis Date  . Asthma   . DDD (degenerative disc disease), lumbar     Past Surgical History:  Procedure Laterality Date  . BACK SURGERY  11/21/2017   L4-L5 Dr Danielle Dess ruptured disc repair, denies hardware implanted    Family History  Problem Relation Age of Onset  . Cirrhosis Father   . Alcohol abuse Father   . Hypertension Father   . Breast cancer Paternal Aunt   . Diabetes Maternal Grandmother   . Hypertension Maternal Grandmother     Social History   Socioeconomic History  . Marital status: Single    Spouse name: Not on file  . Number of children: Not on file  . Years of education: Not on file  . Highest education level: Not on file  Occupational History  . Not on file  Tobacco Use  . Smoking status: Never Smoker  . Smokeless tobacco: Never Used  Substance and Sexual Activity  . Alcohol use: Yes     Comment: occasionally  . Drug use: No  . Sexual activity: Yes    Birth control/protection: None  Other Topics Concern  . Not on file  Social History Narrative  . Not on file      Outpatient Medications Prior to Visit  Medication Sig Dispense Refill  . cefdinir (OMNICEF) 300 MG capsule Take 300 mg by mouth 2 (two) times daily.    . fluconazole (DIFLUCAN) 150 MG tablet SMARTSIG:1 Tablet(s) By Mouth Every 3 Days PRN    . fluticasone (FLONASE) 50 MCG/ACT nasal spray Place 2 sprays into both nostrils daily. 16 g 6  . omeprazole (PRILOSEC) 20 MG capsule Take 1 capsule (20 mg total) by mouth daily. 90 capsule 1  . ondansetron (ZOFRAN ODT) 4 MG disintegrating tablet Take 1 tablet (4 mg total) by mouth every 8 (eight) hours as needed for nausea or vomiting. 33 tablet 0  . Semaglutide,0.25 or 0.5MG /DOS, 2 MG/1.5ML SOPN Inject 0.25 mg into the skin once a week for 30 days, THEN 0.5 mg once a week for 30 days, THEN 1 mg once a week. 5.25 mL 0  . amoxicillin (AMOXIL) 875 MG tablet Take 1 tablet (875 mg total) by mouth 2 (two) times daily. 10 tablet 0   No facility-administered medications prior to visit.    Allergies  Allergen Reactions  . Meloxicam Hives and  Itching  . Other   . Tetanus Toxoids Hives and Swelling  . Nitrofurantoin Itching    Review of Systems  Constitutional: Negative for appetite change, fatigue and unexpected weight change.  HENT: Positive for congestion, ear pain (right ear with fullness and popping) and postnasal drip. Negative for rhinorrhea, sinus pressure, sinus pain and tinnitus.   Eyes: Negative for pain.  Respiratory: Negative for cough and shortness of breath.   Cardiovascular: Negative for chest pain, palpitations and leg swelling.  Gastrointestinal: Negative for abdominal pain, constipation, diarrhea, nausea and vomiting.  Endocrine: Negative for cold intolerance, heat intolerance, polydipsia, polyphagia and polyuria.  Genitourinary: Negative for dysuria,  frequency and hematuria.  Musculoskeletal: Negative for arthralgias, back pain, joint swelling and myalgias.  Skin: Negative for rash.       Pruritic skin lesion to mid-back  Allergic/Immunologic: Negative for environmental allergies.  Neurological: Negative for dizziness and headaches.  Hematological: Negative for adenopathy.  Psychiatric/Behavioral: Negative for decreased concentration and sleep disturbance. The patient is not nervous/anxious.        Objective:    Physical Exam Vitals reviewed.  Constitutional:      Appearance: Normal appearance.  HENT:     Head: Normocephalic.     Right Ear: Tenderness present. Tympanic membrane is erythematous.     Left Ear: Tympanic membrane is erythematous.     Nose: Nose normal.     Mouth/Throat:     Mouth: Mucous membranes are moist.     Pharynx: Posterior oropharyngeal erythema present.  Eyes:     Pupils: Pupils are equal, round, and reactive to light.  Cardiovascular:     Rate and Rhythm: Normal rate and regular rhythm.     Pulses: Normal pulses.     Heart sounds: Normal heart sounds.  Pulmonary:     Effort: Pulmonary effort is normal.     Breath sounds: Normal breath sounds.  Abdominal:     General: Bowel sounds are normal.     Palpations: Abdomen is soft.  Musculoskeletal:        General: Normal range of motion.     Cervical back: Neck supple.  Skin:    General: Skin is warm and dry.     Capillary Refill: Capillary refill takes less than 2 seconds.     Findings: Lesion present.          Comments: 2 mm raised flesh colored skin lesion noted to mid-back  Neurological:     General: No focal deficit present.     Mental Status: She is alert and oriented to person, place, and time.  Psychiatric:        Mood and Affect: Mood normal.        Behavior: Behavior normal.     BP 128/88 (BP Location: Left Arm, Patient Position: Sitting)   Pulse 66   Temp 97.9 F (36.6 C) (Temporal)   Ht 5\' 6"  (1.676 m)   Wt 272 lb (123.4 kg)    SpO2 100%   BMI 43.90 kg/m  Wt Readings from Last 3 Encounters:  02/11/21 272 lb (123.4 kg)  01/15/21 276 lb (125.2 kg)  11/26/20 268 lb (121.6 kg)    Health Maintenance Due  Topic Date Due  . COVID-19 Vaccine (1) Never done  . HIV Screening  Never done  . Hepatitis C Screening  Never done  . TETANUS/TDAP  Never done  . PAP SMEAR-Modifier  Never done     Lab Results  Component Value Date   TSH  1.560 11/07/2020   Lab Results  Component Value Date   WBC 6.3 11/21/2020   HGB 13.3 11/21/2020   HCT 41.1 11/21/2020   MCV 92 11/21/2020   PLT 331 11/21/2020   Lab Results  Component Value Date   NA 138 11/21/2020   K 3.8 11/21/2020   CO2 21 11/21/2020   GLUCOSE 90 11/21/2020   BUN 8 11/21/2020   CREATININE 0.81 11/21/2020   BILITOT 0.4 11/21/2020   ALKPHOS 113 11/21/2020   AST 72 (H) 11/21/2020   ALT 86 (H) 11/21/2020   PROT 6.8 11/21/2020   ALBUMIN 4.1 11/21/2020   CALCIUM 8.8 11/21/2020   ANIONGAP 6 02/24/2018   Lab Results  Component Value Date   CHOL 161 11/07/2020   Lab Results  Component Value Date   HDL 44 11/07/2020   Lab Results  Component Value Date   LDLCALC 100 (H) 11/07/2020   Lab Results  Component Value Date   TRIG 91 11/07/2020   Lab Results  Component Value Date   CHOLHDL 3.7 11/07/2020        Assessment & Plan:   1. Bilateral acute serous otitis media, recurrence not specified - amoxicillin-clavulanate (AUGMENTIN) 875-125 MG tablet; Take 1 tablet by mouth 2 (two) times daily.  Dispense: 20 tablet; Refill: 0 - triamcinolone acetonide (KENALOG-40) injection 60 mg - cefTRIAXone (ROCEPHIN) injection 1 g  2. History of candidiasis - fluconazole (DIFLUCAN) 150 MG tablet; Take 1 tablet (150 mg total) by mouth once for 1 dose.  Dispense: 1 tablet; Refill: 0  3. Back skin lesion - Ambulatory referral to Dermatology    Follow-up: As needed   Signed, Janie Morning, NP

## 2021-02-11 NOTE — Patient Instructions (Signed)
Kenalog steroid and Rocephin injection given in office Take Augmentin twice daily for 10 days Take Diflucan for vaginal yeast infection Take probiotics and eat yogurt to prevent bacteria overgrowth Notify office for severe abdominal pain or profuse diarrhea Follow up as needed  Otitis Media, Adult  Otitis media is a condition in which the middle ear is red and swollen (inflamed) and full of fluid. The middle ear is the part of the ear that contains bones for hearing as well as air that helps send sounds to the brain. The condition usually goes away on its own. What are the causes? This condition is caused by a blockage in the eustachian tube. The eustachian tube connects the middle ear to the back of the nose. It normally allows air into the middle ear. The blockage is caused by fluid or swelling. Problems that can cause blockage include:  A cold or infection that affects the nose, mouth, or throat.  Allergies.  An irritant, such as tobacco smoke.  Adenoids that have become large. The adenoids are soft tissue located in the back of the throat, behind the nose and the roof of the mouth.  Growth or swelling in the upper part of the throat, just behind the nose (nasopharynx).  Damage to the ear caused by change in pressure. This is called barotrauma. What are the signs or symptoms? Symptoms of this condition include:  Ear pain.  Fever.  Problems with hearing.  Being tired.  Fluid leaking from the ear.  Ringing in the ear. How is this treated? This condition can go away on its own within 3-5 days. But if the condition is caused by bacteria or does not go away on its own, or if it keeps coming back, your doctor may:  Give you antibiotic medicines.  Give you medicines for pain. Follow these instructions at home:  Take over-the-counter and prescription medicines only as told by your doctor.  If you were prescribed an antibiotic medicine, take it as told by your doctor. Do not  stop taking the antibiotic even if you start to feel better.  Keep all follow-up visits as told by your doctor. This is important. Contact a doctor if:  You have bleeding from your nose.  There is a lump on your neck.  You are not feeling better in 5 days.  You feel worse instead of better. Get help right away if:  You have pain that is not helped with medicine.  You have swelling, redness, or pain around your ear.  You get a stiff neck.  You cannot move part of your face (paralysis).  You notice that the bone behind your ear hurts when you touch it.  You get a very bad headache. Summary  Otitis media means that the middle ear is red, swollen, and full of fluid.  This condition usually goes away on its own.  If the problem does not go away, treatment may be needed. You may be given medicines to treat the infection or to treat your pain.  If you were prescribed an antibiotic medicine, take it as told by your doctor. Do not stop taking the antibiotic even if you start to feel better.  Keep all follow-up visits as told by your doctor. This is important. This information is not intended to replace advice given to you by your health care provider. Make sure you discuss any questions you have with your health care provider. Document Revised: 08/24/2019 Document Reviewed: 08/24/2019 Elsevier Patient Education  2021  [employer].

## 2021-02-13 ENCOUNTER — Encounter (HOSPITAL_COMMUNITY): Payer: 59

## 2021-02-15 ENCOUNTER — Encounter (HOSPITAL_COMMUNITY): Payer: Self-pay | Admitting: Surgery

## 2021-02-15 DIAGNOSIS — K828 Other specified diseases of gallbladder: Secondary | ICD-10-CM | POA: Diagnosis present

## 2021-02-15 DIAGNOSIS — R1011 Right upper quadrant pain: Secondary | ICD-10-CM | POA: Diagnosis present

## 2021-02-15 NOTE — H&P (Signed)
General Surgery [facility]  Valia Wingard Devita DOB: Jul 25, 1986 Single / Language: [place] / Race: White Female   History of Present Illness   The patient is a 35 year old female who presents for evaluation of gall stones.  CHIEF COMPLAINT: biliary dyskinesia, abdominal pain (intermittent)  Patient is referred by Flonnie Hailstone, NP, for surgical evaluation and management of biliary dyskinesia and abdominal pain. Patient has a history of intermittent abdominal pain. This is frequently epigastric and right upper quadrant. Patient describes a bloating sensation. She has occasional nausea. She has infrequent emesis. She denies any history of jaundice or acholic stools. She has no prior history of hepatobiliary disease or hepatitis or pancreatitis. Patient underwent an ultrasound on November 20, 2020 which showed no sign of gallstones. There was some hepatic steatosis. Patient underwent a nuclear medicine hepatobiliary scan on December 03, 2020. This showed no sign of biliary obstruction. However, with administration of Ensure, the gallbladder ejection fraction was low at 34%. Patient did develop symptoms following the procedure. She is now referred for consideration for cholecystectomy for treatment of biliary dyskinesia. Patient has had no prior abdominal surgery. There is no family history of gallbladder disease.   Past Surgical History  Spinal Surgery - Lower Back   Diagnostic Studies History  Colonoscopy  never Mammogram  never Pap Smear  1-5 years ago  Allergies  No Known Drug Allergies  [01/14/2021]: (Marked as Inactive) Meloxicam *ANALGESICS - ANTI-INFLAMMATORY*  Allergies Reconciled   Medication History  Omeprazole (10MG  Capsule DR, Oral) Active. Ondansetron (4MG  Film, Oral) Active. Semaglutide (3MG  Tablet, Oral) Active. Doxycycline (40MG  Capsule DR, Oral) Active. Adipex-P (37.5MG  Capsule, Oral) Active. Allegra (30MG /5ML Suspension,  Oral) Active. Arnuity Ellipta (50MCG/ACT Aero Pow Br Act, Inhalation) Active. Claritin (5MG  Tablet Chewable, Oral) Active. Fluticasone Furoate (27.5MCG/SPRAY Suspension, Nasal) Active. Fluticasone Propionate (0.05% Lotion, External) Active. Methocarbamol (100MG /ML Solution, Injection) Active. Montelukast Sodium (4MG  Tablet Chewable, Oral) Active. ProAir HFA (108 (90 Base)MCG/ACT Aerosol Soln, Inhalation) Active. Medications Reconciled  Social History Alcohol use  Occasional alcohol use. Caffeine use  Carbonated beverages, Tea. No drug use  Tobacco use  Former smoker.  Family History  Alcohol Abuse  Father. Anesthetic complications  Father. Arthritis  Father, Mother. Breast Cancer  Family Members In General. Diabetes Mellitus  Family Members In General. Hypertension  Family Members In General, Father. Kidney Disease  Father. Migraine Headache  Family Members In General. Thyroid problems  Family Members In General.  Pregnancy / Birth History  Age at menarche  13 years. Gravida  1 Irregular periods  Maternal age  52-20 Para  1  Other Problems  Arthritis  Asthma  Gastroesophageal Reflux Disease  Hypercholesterolemia   Review of Systems General Not Present- Appetite Loss, Chills, Fatigue, Fever, Night Sweats, Weight Gain and Weight Loss. Skin Not Present- Change in Wart/Mole, Dryness, Hives, Jaundice, New Lesions, Non-Healing Wounds, Rash and Ulcer. HEENT Present- Seasonal Allergies and Wears glasses/contact lenses. Not Present- Earache, Hearing Loss, Hoarseness, Nose Bleed, Oral Ulcers, Ringing in the Ears, Sinus Pain, Sore Throat, Visual Disturbances and Yellow Eyes. Respiratory Not Present- Bloody sputum, Chronic Cough, Difficulty Breathing, Snoring and Wheezing. Breast Not Present- Breast Mass, Breast Pain, Nipple Discharge and Skin Changes. Cardiovascular Not Present- Chest Pain, Difficulty Breathing Lying Down, Leg Cramps, Palpitations,  Rapid Heart Rate, Shortness of Breath and Swelling of Extremities. Gastrointestinal Present- Abdominal Pain, Bloating, Change in Bowel Habits, Excessive gas, Gets full quickly at meals, Indigestion and Nausea. Not Present- Bloody Stool, Chronic diarrhea, Constipation, Difficulty  Swallowing, Hemorrhoids, Rectal Pain and Vomiting. Female Genitourinary Not Present- Frequency, Nocturia, Painful Urination, Pelvic Pain and Urgency. Neurological Present- Headaches. Not Present- Decreased Memory, Fainting, Numbness, Seizures, Tingling, Tremor, Trouble walking and Weakness. Psychiatric Not Present- Anxiety, Bipolar, Change in Sleep Pattern, Depression, Fearful and Frequent crying. Endocrine Not Present- Cold Intolerance, Excessive Hunger, Hair Changes, Heat Intolerance, Hot flashes and New Diabetes. Hematology Not Present- Blood Thinners, Easy Bruising, Excessive bleeding, Gland problems, HIV and Persistent Infections.   Physical Exam  GENERAL APPEARANCE Development: normal Nutritional status: normal Gross deformities: none  SKIN Rash, lesions, ulcers: none Induration, erythema: none Nodules: none palpable  EYES Conjunctiva and lids: normal Pupils: equal and reactive Iris: normal bilaterally  EARS, NOSE, MOUTH, THROAT External ears: no lesion or deformity External nose: no lesion or deformity Hearing: grossly normal Due to Covid-19 pandemic, patient is wearing a mask.  NECK Symmetric: yes Trachea: midline Thyroid: no palpable nodules in the thyroid bed  CHEST Respiratory effort: normal Retraction or accessory muscle use: no Breath sounds: normal bilaterally Rales, rhonchi, wheeze: none  CARDIOVASCULAR Auscultation: regular rhythm, normal rate Murmurs: none Pulses: radial pulse 2+ palpable Lower extremity edema: none  ABDOMEN Distension: none Masses: none palpable Tenderness: Mild tenderness to palpation in the epigastrium and right upper quadrant; no Murphy  sign Hepatosplenomegaly: not present Hernia: not present  MUSCULOSKELETAL Station and gait: normal Digits and nails: no clubbing or cyanosis Muscle strength: grossly normal all extremities Range of motion: grossly normal all extremities Deformity: none  LYMPHATIC Cervical: none palpable Supraclavicular: none palpable  PSYCHIATRIC Oriented to person, place, and time: yes Mood and affect: normal for situation Judgment and insight: appropriate for situation    Assessment & Plan  BILIARY DYSKINESIA (K82.8) ABDOMINAL PAIN, RIGHT UPPER QUADRANT (R10.11)  Patient is referred by her primary care provider for surgical evaluation and management of biliary dyskinesia and right upper quadrant abdominal pain. Patient has a family member for surgical care past.  Patient is provided with written literature on laparoscopic gallbladder surgery to review at home.  Patient has had prolonged intermittent symptoms of epigastric and right upper quadrant abdominal pain following meals. Ultrasound failed to demonstrate cholelithiasis. Nuclear medicine hepatobiliary scan did show evidence of biliary dyskinesia and the patient developed symptoms following the study. Today we discussed laparoscopic cholecystectomy with intraoperative cholangiography. We discussed the procedure. We discussed the risk of conversion to open surgery. We discussed the hospital stay to be anticipated. We discussed the postoperative recovery and return to work and activities. We discussed the fact that with cholelithiasis, approximately 95% of patients achieve symptomatic improvement. Without cholelithiasis and with a diagnosis of biliary dyskinesia, approximately 60-70% of the patients achieve symptomatic improvement. The patient understands and wishes to proceed with surgery in the near future.  The risks and benefits of the procedure have been discussed at length with the patient. The patient understands the proposed  procedure, potential alternative treatments, and the course of recovery to be expected. All of the patient's questions have been answered at this time. The patient wishes to proceed with surgery.  Darnell Level, MD [facility] Office: [phone]

## 2021-02-17 ENCOUNTER — Other Ambulatory Visit (HOSPITAL_COMMUNITY): Payer: 59

## 2021-02-19 NOTE — Progress Notes (Signed)
DUE TO COVID-19 ONLY ONE VISITOR IS ALLOWED TO COME WITH YOU AND STAY IN THE WAITING ROOM ONLY DURING PRE OP AND PROCEDURE DAY OF SURGERY. THE 1 VISITOR  MAY VISIT WITH YOU AFTER SURGERY IN YOUR PRIVATE ROOM DURING VISITING HOURS ONLY!  YOU NEED TO HAVE A COVID 19 TEST ON_5/24/2022 ______ @_______ , THIS TEST MUST BE DONE BEFORE SURGERY,  COVID TESTING SITE [address] [place] , IT IS ON THE RIGHT GOING OUT [address] APPROXIMATELY  2 MINUTES PAST [employer] ON THE RIGHT. ONCE YOUR COVID TEST IS COMPLETED,  PLEASE BEGIN THE QUARANTINE INSTRUCTIONS AS OUTLINED IN YOUR HANDOUT.                Megan Barrera  02/19/2021   Your procedure is scheduled on:  02/28/2021   Report to [facility] Main  Entrance   Report to admitting at        0845 AM     Call this number if you have problems the morning of surgery [phone]    REMEMBER: NO  SOLID FOOD CANDY OR GUM AFTER MIDNIGHT. CLEAR LIQUIDS UNTIL     0745am     . NOTHING BY MOUTH EXCEPT CLEAR LIQUIDS UNTIL      0745am   . PLEASE FINISH ENSURE DRINK PER SURGEON ORDER  WHICH NEEDS TO BE COMPLETED AT    0745am    .      CLEAR LIQUID DIET   Foods Allowed                                                                    Coffee and tea, regular and decaf                            Fruit ices (not with fruit pulp)                                      Iced Popsicles                                    Carbonated beverages, regular and diet                                    Cranberry, grape and apple juices Sports drinks like Gatorade Lightly seasoned clear broth or consume(fat free) Sugar, honey syrup ___________________________________________________________________      BRUSH YOUR TEETH MORNING OF SURGERY AND RINSE YOUR MOUTH OUT, NO CHEWING GUM CANDY OR MINTS.     Take these medicines the morning of surgery with A SIP OF WATER:       Prilosec, zyrtec  DO NOT TAKE ANY DIABETIC MEDICATIONS  DAY OF YOUR SURGERY                               You may not have any metal on your body including hair pins and  piercings  Do not wear jewelry, make-up, lotions, powders or perfumes, deodorant             Do not wear nail polish on your fingernails.  Do not shave  48 hours prior to surgery.              Men may shave face and neck.   Do not bring valuables to the hospital. [place].  Contacts, dentures or bridgework may not be worn into surgery.  Leave suitcase in the car. After surgery it may be brought to your room.     Patients discharged the day of surgery will not be allowed to drive home. IF YOU ARE HAVING SURGERY AND GOING HOME THE SAME DAY, YOU MUST HAVE AN ADULT TO DRIVE YOU HOME AND BE WITH YOU FOR 24 HOURS. YOU MAY GO HOME BY TAXI OR UBER OR ORTHERWISE, BUT AN ADULT MUST ACCOMPANY YOU HOME AND STAY WITH YOU FOR 24 HOURS.  Name and phone number of your driver:  Special Instructions: N/A              Please read over the following fact sheets you were given: _____________________________________________________________________  [employer] - Preparing for Surgery Before surgery, you can play an important role.  Because skin is not sterile, your skin needs to be as free of germs as possible.  You can reduce the number of germs on your skin by washing with CHG (chlorahexidine gluconate) soap before surgery.  CHG is an antiseptic cleaner which kills germs and bonds with the skin to continue killing germs even after washing. Please DO NOT use if you have an allergy to CHG or antibacterial soaps.  If your skin becomes reddened/irritated stop using the CHG and inform your nurse when you arrive at Short Stay. Do not shave (including legs and underarms) for at least 48 hours prior to the first CHG shower.  You may shave your face/neck. Please follow these instructions carefully:  1.  Shower with CHG Soap the night before  surgery and the  morning of Surgery.  2.  If you choose to wash your hair, wash your hair first as usual with your  normal  shampoo.  3.  After you shampoo, rinse your hair and body thoroughly to remove the  shampoo.                           4.  Use CHG as you would any other liquid soap.  You can apply chg directly  to the skin and wash                       Gently with a scrungie or clean washcloth.  5.  Apply the CHG Soap to your body ONLY FROM THE NECK DOWN.   Do not use on face/ open                           Wound or open sores. Avoid contact with eyes, ears mouth and genitals (private parts).                       Wash face,  Genitals (private parts) with your normal soap.             6.  Wash thoroughly, paying special attention to the area where your surgery  will be performed.  7.  Thoroughly rinse your body with warm water from the neck down.  8.  DO NOT shower/wash with your normal soap after using and rinsing off  the CHG Soap.                9.  Pat yourself dry with a clean towel.            10.  Wear clean pajamas.            11.  Place clean sheets on your bed the night of your first shower and do not  sleep with pets. Day of Surgery : Do not apply any lotions/deodorants the morning of surgery.  Please wear clean clothes to the hospital/surgery center.  FAILURE TO FOLLOW THESE INSTRUCTIONS MAY RESULT IN THE CANCELLATION OF YOUR SURGERY PATIENT SIGNATURE_________________________________  NURSE SIGNATURE__________________________________  ________________________________________________________________________

## 2021-02-21 ENCOUNTER — Other Ambulatory Visit: Payer: Self-pay

## 2021-02-21 ENCOUNTER — Encounter (HOSPITAL_COMMUNITY)
Admission: RE | Admit: 2021-02-21 | Discharge: 2021-02-21 | Disposition: A | Discharge status: Home / Self Care | Payer: 59 | Source: Ambulatory Visit | Attending: Surgery | Admitting: Surgery

## 2021-02-21 ENCOUNTER — Encounter (HOSPITAL_COMMUNITY): Payer: Self-pay

## 2021-02-21 DIAGNOSIS — Z01818 Encounter for other preprocedural examination: Secondary | ICD-10-CM | POA: Diagnosis not present

## 2021-02-21 HISTORY — DX: Headache, unspecified: R51.9

## 2021-02-21 HISTORY — DX: Anxiety disorder, unspecified: F41.9

## 2021-02-21 HISTORY — DX: Prediabetes: R73.03

## 2021-02-21 HISTORY — DX: Anemia, unspecified: D64.9

## 2021-02-21 LAB — CBC
HCT: 42.8 % (ref 36.0–46.0)
Hemoglobin: 13.7 g/dL (ref 12.0–15.0)
MCH: 30.3 pg (ref 26.0–34.0)
MCHC: 32 g/dL (ref 30.0–36.0)
MCV: 94.7 fL (ref 80.0–100.0)
Platelets: 354 10*3/uL (ref 150–400)
RBC: 4.52 MIL/uL (ref 3.87–5.11)
RDW: 12.8 % (ref 11.5–15.5)
WBC: 7.2 10*3/uL (ref 4.0–10.5)
nRBC: 0 % (ref 0.0–0.2)

## 2021-02-21 LAB — BASIC METABOLIC PANEL
Anion gap: 5 (ref 5–15)
BUN: 10 mg/dL (ref 6–20)
CO2: 29 mmol/L (ref 22–32)
Calcium: 8.9 mg/dL (ref 8.9–10.3)
Chloride: 103 mmol/L (ref 98–111)
Creatinine, Ser: 0.88 mg/dL (ref 0.44–1.00)
GFR, Estimated: >60 mL/min (ref >60)
Glucose, Bld: 91 mg/dL (ref 70–99)
Potassium: 4 mmol/L (ref 3.5–5.1)
Sodium: 137 mmol/L (ref 135–145)

## 2021-02-21 LAB — HEMOGLOBIN A1C
Hgb A1c MFr Bld: 5.4 % (ref 4.8–5.6)
Mean Plasma Glucose: 108.28 mg/dL

## 2021-02-21 NOTE — Progress Notes (Addendum)
Anesthesia Review:  PCP: Flonnie Hailstone with [facility] 02/11/21  Cardiologist : Chest x-ray : EKG : 02/21/21  Echo : Stress test: Cardiac Cath :  Activity level: can do a flight of stairs without difficulty  Sleep Study/ CPAP : none  Fasting Blood Sugar :      / Checks Blood Sugar -- times a day:   Blood Thinner/ Instructions /Last Dose: ASA / Instructions/ Last Dose :  Prediabetes - pt to start semaglutide after surgery per pt  hgba1c-02/21/2021- 5.4  Pt was 30 minutes late for preop appt.  Medical hx, surgical hx, travel screening and sleep apnea quesitons were completed along with preop instructions and labs.

## 2021-02-25 ENCOUNTER — Other Ambulatory Visit (HOSPITAL_COMMUNITY)
Admission: RE | Admit: 2021-02-25 | Discharge: 2021-02-25 | Disposition: A | Discharge status: Home / Self Care | Payer: 59 | Source: Ambulatory Visit | Attending: Surgery | Admitting: Surgery

## 2021-02-25 DIAGNOSIS — Z20822 Contact with and (suspected) exposure to covid-19: Secondary | ICD-10-CM | POA: Diagnosis not present

## 2021-02-25 DIAGNOSIS — Z01812 Encounter for preprocedural laboratory examination: Secondary | ICD-10-CM | POA: Insufficient documentation

## 2021-02-25 LAB — SARS CORONAVIRUS 2 (TAT 6-24 HRS): SARS Coronavirus 2: NEGATIVE

## 2021-02-28 ENCOUNTER — Ambulatory Visit (HOSPITAL_COMMUNITY)
Admission: RE | Admit: 2021-02-28 | Discharge: 2021-03-01 | Disposition: A | Discharge status: Home / Self Care | Payer: 59 | Attending: Surgery | Admitting: Surgery

## 2021-02-28 ENCOUNTER — Ambulatory Visit (HOSPITAL_COMMUNITY): Payer: 59 | Admitting: Anesthesiology

## 2021-02-28 ENCOUNTER — Ambulatory Visit (HOSPITAL_COMMUNITY): Payer: 59

## 2021-02-28 ENCOUNTER — Encounter (HOSPITAL_COMMUNITY)
Admission: RE | Disposition: A | Discharge status: Home / Self Care | Payer: Self-pay | Source: Home / Self Care | Attending: Surgery

## 2021-02-28 ENCOUNTER — Other Ambulatory Visit: Payer: Self-pay

## 2021-02-28 ENCOUNTER — Ambulatory Visit (HOSPITAL_COMMUNITY): Payer: 59 | Admitting: Emergency Medicine

## 2021-02-28 ENCOUNTER — Encounter (HOSPITAL_COMMUNITY): Payer: Self-pay | Admitting: Surgery

## 2021-02-28 DIAGNOSIS — K811 Chronic cholecystitis: Secondary | ICD-10-CM | POA: Insufficient documentation

## 2021-02-28 DIAGNOSIS — Z79899 Other long term (current) drug therapy: Secondary | ICD-10-CM | POA: Diagnosis not present

## 2021-02-28 DIAGNOSIS — Z6841 Body Mass Index (BMI) 40.0 and over, adult: Secondary | ICD-10-CM | POA: Diagnosis not present

## 2021-02-28 DIAGNOSIS — Z886 Allergy status to analgesic agent status: Secondary | ICD-10-CM | POA: Insufficient documentation

## 2021-02-28 DIAGNOSIS — K760 Fatty (change of) liver, not elsewhere classified: Secondary | ICD-10-CM | POA: Diagnosis not present

## 2021-02-28 DIAGNOSIS — K828 Other specified diseases of gallbladder: Secondary | ICD-10-CM | POA: Diagnosis present

## 2021-02-28 DIAGNOSIS — R1011 Right upper quadrant pain: Secondary | ICD-10-CM | POA: Diagnosis present

## 2021-02-28 DIAGNOSIS — Z419 Encounter for procedure for purposes other than remedying health state, unspecified: Secondary | ICD-10-CM

## 2021-02-28 DIAGNOSIS — Z87891 Personal history of nicotine dependence: Secondary | ICD-10-CM | POA: Diagnosis not present

## 2021-02-28 DIAGNOSIS — Z7951 Long term (current) use of inhaled steroids: Secondary | ICD-10-CM | POA: Diagnosis not present

## 2021-02-28 HISTORY — PX: CHOLECYSTECTOMY: SHX55

## 2021-02-28 LAB — PREGNANCY, URINE: Preg Test, Ur: NEGATIVE

## 2021-02-28 SURGERY — LAPAROSCOPIC CHOLECYSTECTOMY WITH INTRAOPERATIVE CHOLANGIOGRAM
Anesthesia: General | Site: Abdomen

## 2021-02-28 MED ORDER — SODIUM CHLORIDE 0.45 % IV SOLN: INTRAVENOUS | Status: DC

## 2021-02-28 MED ORDER — TRAMADOL HCL 50 MG PO TABS: 50.0000 mg | ORAL_TABLET | Freq: Four times a day (QID) | ORAL | 0 refills | Status: AC | PRN

## 2021-02-28 MED ORDER — ROCURONIUM BROMIDE 10 MG/ML (PF) SYRINGE
PREFILLED_SYRINGE | INTRAVENOUS | Status: DC | PRN
  Administered 2021-02-28: 50 mg via INTRAVENOUS
  Administered 2021-02-28: 10 mg via INTRAVENOUS

## 2021-02-28 MED ORDER — TRAMADOL HCL 50 MG PO TABS: 50.0000 mg | ORAL_TABLET | Freq: Four times a day (QID) | ORAL | Status: DC | PRN

## 2021-02-28 MED ORDER — FENTANYL CITRATE (PF) 250 MCG/5ML IJ SOLN
INTRAMUSCULAR | Status: AC
  Filled 2021-02-28: qty 5

## 2021-02-28 MED ORDER — ONDANSETRON HCL 4 MG/2ML IJ SOLN
INTRAMUSCULAR | Status: AC
  Filled 2021-02-28: qty 2

## 2021-02-28 MED ORDER — BUPIVACAINE-EPINEPHRINE (PF) 0.5% -1:200000 IJ SOLN
INTRAMUSCULAR | Status: AC
  Filled 2021-02-28: qty 30

## 2021-02-28 MED ORDER — 0.9 % SODIUM CHLORIDE (POUR BTL) OPTIME
TOPICAL | Status: DC | PRN
  Administered 2021-02-28: 1000 mL

## 2021-02-28 MED ORDER — ONDANSETRON HCL 4 MG/2ML IJ SOLN: 4.0000 mg | Freq: Four times a day (QID) | INTRAMUSCULAR | Status: DC | PRN

## 2021-02-28 MED ORDER — BUPIVACAINE-EPINEPHRINE 0.5% -1:200000 IJ SOLN
INTRAMUSCULAR | Status: DC | PRN
  Administered 2021-02-28: 30 mL

## 2021-02-28 MED ORDER — MIDAZOLAM HCL 2 MG/2ML IJ SOLN
INTRAMUSCULAR | Status: AC
  Filled 2021-02-28: qty 2

## 2021-02-28 MED ORDER — HYDROMORPHONE HCL 1 MG/ML IJ SOLN: 0.2500 mg | INTRAMUSCULAR | Status: DC | PRN

## 2021-02-28 MED ORDER — ONDANSETRON 4 MG PO TBDP: 4.0000 mg | ORAL_TABLET | Freq: Four times a day (QID) | ORAL | Status: DC | PRN

## 2021-02-28 MED ORDER — HYDROMORPHONE HCL 1 MG/ML IJ SOLN
1.0000 mg | INTRAMUSCULAR | Status: DC | PRN
  Administered 2021-02-28: 1 mg via INTRAVENOUS
  Filled 2021-02-28: qty 1

## 2021-02-28 MED ORDER — PROPOFOL 10 MG/ML IV BOLUS
INTRAVENOUS | Status: AC
  Filled 2021-02-28: qty 20

## 2021-02-28 MED ORDER — ORAL CARE MOUTH RINSE: 15.0000 mL | Freq: Once | OROMUCOSAL | Status: AC

## 2021-02-28 MED ORDER — LACTATED RINGERS IV SOLN: INTRAVENOUS | Status: DC

## 2021-02-28 MED ORDER — CHLORHEXIDINE GLUCONATE CLOTH 2 % EX PADS
6.0000 | MEDICATED_PAD | Freq: Once | CUTANEOUS | Status: DC
  Administered 2021-02-28: 6 via TOPICAL

## 2021-02-28 MED ORDER — SUGAMMADEX SODIUM 500 MG/5ML IV SOLN
INTRAVENOUS | Status: DC | PRN
  Administered 2021-02-28: 300 mg via INTRAVENOUS

## 2021-02-28 MED ORDER — OXYCODONE HCL 5 MG PO TABS
ORAL_TABLET | ORAL | Status: AC
  Filled 2021-02-28: qty 1

## 2021-02-28 MED ORDER — ACETAMINOPHEN 500 MG PO TABS
1000.0000 mg | ORAL_TABLET | Freq: Once | ORAL | Status: AC
  Administered 2021-02-28: 1000 mg via ORAL
  Filled 2021-02-28: qty 2

## 2021-02-28 MED ORDER — SUGAMMADEX SODIUM 500 MG/5ML IV SOLN
INTRAVENOUS | Status: AC
  Filled 2021-02-28: qty 5

## 2021-02-28 MED ORDER — CHLORHEXIDINE GLUCONATE 0.12 % MT SOLN
15.0000 mL | Freq: Once | OROMUCOSAL | Status: AC
  Administered 2021-02-28: 15 mL via OROMUCOSAL

## 2021-02-28 MED ORDER — CEFAZOLIN IN SODIUM CHLORIDE 3-0.9 GM/100ML-% IV SOLN
3.0000 g | INTRAVENOUS | Status: AC
  Administered 2021-02-28: 3 g via INTRAVENOUS
  Filled 2021-02-28: qty 100

## 2021-02-28 MED ORDER — ONDANSETRON HCL 4 MG/2ML IJ SOLN
INTRAMUSCULAR | Status: DC | PRN
  Administered 2021-02-28: 4 mg via INTRAVENOUS

## 2021-02-28 MED ORDER — LIDOCAINE 2% (20 MG/ML) 5 ML SYRINGE
INTRAMUSCULAR | Status: DC | PRN
  Administered 2021-02-28: 60 mg via INTRAVENOUS

## 2021-02-28 MED ORDER — MIDAZOLAM HCL 5 MG/5ML IJ SOLN
INTRAMUSCULAR | Status: DC | PRN
  Administered 2021-02-28: 2 mg via INTRAVENOUS

## 2021-02-28 MED ORDER — ACETAMINOPHEN 325 MG PO TABS: 650.0000 mg | ORAL_TABLET | Freq: Four times a day (QID) | ORAL | Status: DC | PRN

## 2021-02-28 MED ORDER — LIDOCAINE 2% (20 MG/ML) 5 ML SYRINGE
INTRAMUSCULAR | Status: AC
  Filled 2021-02-28: qty 5

## 2021-02-28 MED ORDER — ACETAMINOPHEN 650 MG RE SUPP
650.0000 mg | Freq: Four times a day (QID) | RECTAL | Status: DC | PRN
  Filled 2021-02-28: qty 1

## 2021-02-28 MED ORDER — FENTANYL CITRATE (PF) 250 MCG/5ML IJ SOLN
INTRAMUSCULAR | Status: DC | PRN
  Administered 2021-02-28: 50 ug via INTRAVENOUS
  Administered 2021-02-28: 100 ug via INTRAVENOUS
  Administered 2021-02-28 (×2): 50 ug via INTRAVENOUS

## 2021-02-28 MED ORDER — PROPOFOL 10 MG/ML IV BOLUS
INTRAVENOUS | Status: DC | PRN
  Administered 2021-02-28: 150 mg via INTRAVENOUS

## 2021-02-28 MED ORDER — DEXAMETHASONE SODIUM PHOSPHATE 10 MG/ML IJ SOLN
INTRAMUSCULAR | Status: DC | PRN
  Administered 2021-02-28: 5 mg via INTRAVENOUS

## 2021-02-28 MED ORDER — ROCURONIUM BROMIDE 10 MG/ML (PF) SYRINGE
PREFILLED_SYRINGE | INTRAVENOUS | Status: AC
  Filled 2021-02-28: qty 10

## 2021-02-28 MED ORDER — LACTATED RINGERS IR SOLN
Status: DC | PRN
  Administered 2021-02-28: 1000 mL

## 2021-02-28 MED ORDER — OXYCODONE HCL 5 MG PO TABS
5.0000 mg | ORAL_TABLET | ORAL | Status: DC | PRN
  Administered 2021-02-28 – 2021-03-01 (×2): 5 mg via ORAL
  Filled 2021-02-28: qty 1

## 2021-02-28 MED ORDER — IOHEXOL 300 MG/ML  SOLN
INTRAMUSCULAR | Status: DC | PRN
  Administered 2021-02-28: 8 mL

## 2021-02-28 SURGICAL SUPPLY — 38 items
ADH SKN CLS APL DERMABOND .7 (GAUZE/BANDAGES/DRESSINGS) ×1
APL PRP STRL LF DISP 70% ISPRP (MISCELLANEOUS) ×2
APPLIER CLIP ROT 10 11.4 M/L (STAPLE) ×2
APR CLP MED LRG 11.4X10 (STAPLE) ×1
BAG SPEC RTRVL LRG 6X4 10 (ENDOMECHANICALS) ×1
CABLE HIGH FREQUENCY MONO STRZ (ELECTRODE) ×2 IMPLANT
CHLORAPREP W/TINT 26 (MISCELLANEOUS) ×4 IMPLANT
CLIP APPLIE ROT 10 11.4 M/L (STAPLE) ×1 IMPLANT
COVER MAYO STAND STRL (DRAPES) ×2 IMPLANT
COVER SURGICAL LIGHT HANDLE (MISCELLANEOUS) ×2 IMPLANT
COVER WAND RF STERILE (DRAPES) IMPLANT
DECANTER SPIKE VIAL GLASS SM (MISCELLANEOUS) ×2 IMPLANT
DERMABOND ADVANCED (GAUZE/BANDAGES/DRESSINGS) ×1
DERMABOND ADVANCED .7 DNX12 (GAUZE/BANDAGES/DRESSINGS) IMPLANT
DRAPE C-ARM 42X120 X-RAY (DRAPES) ×2 IMPLANT
ELECT REM PT RETURN 15FT ADLT (MISCELLANEOUS) ×2 IMPLANT
GAUZE SPONGE 2X2 8PLY STRL LF (GAUZE/BANDAGES/DRESSINGS) ×1 IMPLANT
GLOVE SURG ORTHO LTX SZ8 (GLOVE) ×2 IMPLANT
GOWN STRL REUS W/TWL XL LVL3 (GOWN DISPOSABLE) ×4 IMPLANT
HEMOSTAT SURGICEL 4X8 (HEMOSTASIS) IMPLANT
KIT BASIN OR (CUSTOM PROCEDURE TRAY) ×2 IMPLANT
KIT TURNOVER KIT A (KITS) ×2 IMPLANT
PENCIL SMOKE EVACUATOR (MISCELLANEOUS) IMPLANT
POUCH SPECIMEN RETRIEVAL 10MM (ENDOMECHANICALS) ×2 IMPLANT
SCISSORS LAP 5X35 DISP (ENDOMECHANICALS) ×2 IMPLANT
SET CHOLANGIOGRAPH MIX (MISCELLANEOUS) ×2 IMPLANT
SET IRRIG TUBING LAPAROSCOPIC (IRRIGATION / IRRIGATOR) ×2 IMPLANT
SET TUBE SMOKE EVAC HIGH FLOW (TUBING) ×2 IMPLANT
SLEEVE XCEL OPT CAN 5 100 (ENDOMECHANICALS) ×2 IMPLANT
SPONGE GAUZE 2X2 STER 10/PKG (GAUZE/BANDAGES/DRESSINGS) ×1
STRIP CLOSURE SKIN 1/2X4 (GAUZE/BANDAGES/DRESSINGS) IMPLANT
SUT MNCRL AB 4-0 PS2 18 (SUTURE) ×2 IMPLANT
TOWEL OR 17X26 10 PK STRL BLUE (TOWEL DISPOSABLE) ×2 IMPLANT
TOWEL OR NON WOVEN STRL DISP B (DISPOSABLE) ×2 IMPLANT
TRAY LAPAROSCOPIC (CUSTOM PROCEDURE TRAY) ×2 IMPLANT
TROCAR BLADELESS OPT 5 100 (ENDOMECHANICALS) ×2 IMPLANT
TROCAR XCEL BLUNT TIP 100MML (ENDOMECHANICALS) ×2 IMPLANT
TROCAR XCEL NON-BLD 11X100MML (ENDOMECHANICALS) ×2 IMPLANT

## 2021-02-28 NOTE — Discharge Instructions (Signed)
[facility]  LAPAROSCOPIC SURGERY:  POST-OP INSTRUCTIONS  Always review your discharge instruction sheet given to you by the facility where your surgery was performed.  A prescription for pain medication may be given to you upon discharge.  Take your pain medication as prescribed.  If narcotic pain medicine is not needed, then you may take acetaminophen (Tylenol) or ibuprofen (Advil) as needed.  Take your usually prescribed medications unless otherwise directed.  If you need a refill on your pain medication, please contact your pharmacy.  They will contact our office to request authorization. Prescriptions will not be filled after 5 P.M. or on weekends.  You should follow a light diet the first few days after arrival home, such as soup and crackers or toast.  Be sure to include plenty of fluids daily.  Most patients will experience some swelling and bruising in the area of the incisions.  Ice packs will help.  Swelling and bruising can take several days to resolve.   It is common to experience some constipation after surgery.  Increasing fluid intake and taking a stool softener (such as Colace) will usually help or prevent this problem from occurring.  A mild laxative (Milk of Magnesia or Miralax) should be taken according to package instructions if there has been no bowel movement after 48 hours.  You will likely have Dermabond (topical glue) over your incisions.  This seals the incisions and allows you to bathe and shower at any time after your surgery.  Glue should remain in place for up to 10 days.  It may be removed after 10 days by pealing off the Dermabond material or using Vaseline or naval jelly to remove.  If you have steri-strips over your incisions, you may remove the gauze bandage on the second day after surgery, and you may shower at that time.  Leave your steri-strips (small skin tapes) in place directly over the incision.  These strips should remain on the  skin for 5-7 days and then be removed.  You may get them wet in the shower and pat them dry.  Any sutures or staples will be removed at the office during your follow-up visit.  ACTIVITIES:  You may resume regular (light) daily activities beginning the next day - such as daily self-care, walking, climbing stairs - gradually increasing activities as tolerated.  You may have sexual intercourse when it is comfortable.  Refrain from any heavy lifting or straining until approved by your doctor.  You may drive when you are no longer taking prescription pain medication, when you can comfortably wear a seatbelt, and when you can safely maneuver your car and apply brakes.  You should see your doctor in the office for a follow-up appointment approximately 2-3 weeks after your surgery.  Make sure that you call for this appointment within a day or two after you arrive home to insure a convenient appointment time.  WHEN TO CALL YOUR DOCTOR: 1. Fever over 101.0 2. Inability to urinate 3. Continued bleeding from incision 4. Increased pain, redness, or drainage from the incision 5. Increasing abdominal pain  The clinic staff is available to answer your questions during regular business hours.  Please don't hesitate to call and ask to speak to one of the nurses for clinical concerns.  If you have a medical emergency, go to the nearest emergency room or call 911.  A surgeon from [facility] is always on call for the hospital.  Valli Randol, MD [facility] Office: [phone] Toll Free:  [phone] FAX [phone]  Website: [email or web address] 

## 2021-02-28 NOTE — Anesthesia Preprocedure Evaluation (Addendum)
Anesthesia Evaluation  Patient identified by MRN, date of birth, ID band Patient awake    Reviewed: Allergy & Precautions, H&P , NPO status , Patient's Chart, lab work & pertinent test results  Airway Mallampati: III  TM Distance: >3 FB Neck ROM: Full    Dental no notable dental hx. (+) Teeth Intact, Dental Advisory Given   Pulmonary neg pulmonary ROS,    Pulmonary exam normal breath sounds clear to auscultation       Cardiovascular negative cardio ROS   Rhythm:Regular Rate:Normal     Neuro/Psych  Headaches, Anxiety    GI/Hepatic negative GI ROS, Neg liver ROS,   Endo/Other  Morbid obesity  Renal/GU negative Renal ROS  negative genitourinary   Musculoskeletal  (+) Arthritis ,   Abdominal   Peds  Hematology  (+) Blood dyscrasia, anemia ,   Anesthesia Other Findings   Reproductive/Obstetrics negative OB ROS                            Anesthesia Physical Anesthesia Plan  ASA: II  Anesthesia Plan: General   Post-op Pain Management:    Induction: Intravenous  PONV Risk Score and Plan: 4 or greater and Ondansetron, Dexamethasone and Midazolam  Airway Management Planned: Oral ETT  Additional Equipment:   Intra-op Plan:   Post-operative Plan: Extubation in OR  Informed Consent: I have reviewed the patients History and Physical, chart, labs and discussed the procedure including the risks, benefits and alternatives for the proposed anesthesia with the patient or authorized representative who has indicated his/her understanding and acceptance.     Dental advisory given  Plan Discussed with: CRNA  Anesthesia Plan Comments:         Anesthesia Quick Evaluation

## 2021-02-28 NOTE — Interval H&P Note (Signed)
History and Physical Interval Note:  02/28/2021 10:20 AM  Megan Barrera  has presented today for surgery, with the diagnosis of BILIARY DYSKINESIA.  The various methods of treatment have been discussed with the patient and family. After consideration of risks, benefits and other options for treatment, the patient has consented to    Procedure(s): LAPAROSCOPIC CHOLECYSTECTOMY WITH INTRAOPERATIVE CHOLANGIOGRAM (N/A) as a surgical intervention.    The patient's history has been reviewed, patient examined, no change in status, stable for surgery.  I have reviewed the patient's chart and labs.  Questions were answered to the patient's satisfaction.    Darnell Level, MD [facility] Office: [phone]   Darnell Level

## 2021-02-28 NOTE — Transfer of Care (Signed)
Immediate Anesthesia Transfer of Care Note  Patient: Megan Barrera  Procedure(s) Performed: LAPAROSCOPIC CHOLECYSTECTOMY WITH INTRAOPERATIVE CHOLANGIOGRAM (N/A Abdomen)  Patient Location: PACU  Anesthesia Type:General  Level of Consciousness: awake, drowsy and responds to stimulation  Airway & Oxygen Therapy: Patient Spontanous Breathing and Patient connected to face mask oxygen  Post-op Assessment: Report given to RN and Post -op Vital signs reviewed and stable  Post vital signs: Reviewed and stable  Last Vitals:  Vitals Value Taken Time  BP 133/82 02/28/21 1207  Temp 36.8 C 02/28/21 1207  Pulse 57 02/28/21 1207  Resp 15 02/28/21 1207  SpO2 100 % 02/28/21 1207  Vitals shown include unvalidated device data.  Last Pain:  Vitals:   02/28/21 0924  TempSrc: Oral  PainSc:          Complications: No complications documented.

## 2021-02-28 NOTE — Op Note (Signed)
Procedure Note  Pre-operative Diagnosis:  Biliary dyskinesia, abdominal pain  Post-operative Diagnosis:  same  Surgeon:  Darnell Level, MD  Assistant:  none   Procedure:  Laparoscopic cholecystectomy with intra-operative cholangiography  Anesthesia:  General  Estimated Blood Loss:  minimal  Drains: none         Specimen: gallbladder to pathology  Indications:  Patient is referred by Flonnie Hailstone, NP, for surgical evaluation and management of biliary dyskinesia and abdominal pain. Patient has a history of intermittent abdominal pain. This is frequently epigastric and right upper quadrant. Patient describes a bloating sensation. She has occasional nausea. She has infrequent emesis. She denies any history of jaundice or acholic stools. She has no prior history of hepatobiliary disease or hepatitis or pancreatitis. Patient underwent an ultrasound on November 20, 2020 which showed no sign of gallstones. There was some hepatic steatosis. Patient underwent a nuclear medicine hepatobiliary scan on December 03, 2020. This showed no sign of biliary obstruction. However, with administration of Ensure, the gallbladder ejection fraction was low at 34%. Patient did develop symptoms following the procedure. She is now referred for consideration for cholecystectomy for treatment of biliary dyskinesia.   Procedure Details:  The patient was seen in the pre-op holding area. The risks, benefits, complications, treatment options, and expected outcomes were previously discussed with the patient. The patient agreed with the proposed plan and has signed the informed consent form.  The patient was transported to operating room #1 at the [facility]. The patient was placed in the supine position on the operating room table. Following induction of general anesthesia, the abdomen was prepped and draped in the usual aseptic fashion.  An incision was made in the skin near the umbilicus. The midline  fascia was incised and the peritoneal cavity was entered and a Hasson cannula was introduced under direct vision. The cannula was secured with a 0-Vicryl pursestring suture. Pneumoperitoneum was established with carbon dioxide. Additional cannulae were introduced under direct vision along the right costal margin in the midline, mid-clavicular line, and anterior axillary line.   The gallbladder was identified and the fundus grasped and retracted cephalad. Adhesions were taken down bluntly and the electrocautery was utilized as needed, taking care not to involve any adjacent structures. The infundibulum was grasped and retracted laterally, exposing the peritoneum overlying the triangle of Calot. The peritoneum was incised and structures exposed with blunt dissection. The cystic duct was clearly identified, bluntly dissected circumferentially, and clipped at the neck of the gallbladder.  An incision was made in the cystic duct and the cholangiogram catheter introduced. The catheter was secured using an ligaclip.  Real-time cholangiography was performed using C-arm fluoroscopy.  There was rapid filling of a normal caliber common bile duct.  There was reflux of contrast into the left and right hepatic ductal systems.  There was free flow distally into the duodenum without filling defect or obstruction.  The catheter was removed from the peritoneal cavity.  The cystic duct was then ligated with ligaclips and divided. The cystic artery was identified, dissected circumferentially, ligated with ligaclips, and divided.  The gallbladder was dissected away from the gallbladder bed using the electrocautery for hemostasis. The gallbladder was completely removed from the liver and placed into an endocatch bag. The gallbladder was removed in the endocatch bag through the umbilical port site and submitted to pathology for review.  The right upper quadrant was irrigated and the gallbladder bed was inspected. Hemostasis was  achieved with the electrocautery.  Cannulae  were removed under direct vision and good hemostasis was noted. Pneumoperitoneum was released and the majority of the carbon dioxide evacuated. The umbilical wound was irrigated and the fascia was then closed with the pursestring suture.  Local anesthetic was infiltrated at all port sites. Skin incisions were closed with 4-0 Monocril subcuticular sutures and Dermabond was applied.  Instrument, sponge, and needle counts were correct at the conclusion of the case.  The patient was awakened from anesthesia and brought to the recovery room in stable condition.  The patient tolerated the procedure well.   Darnell Level, MD [facility] Office: [phone]

## 2021-02-28 NOTE — Anesthesia Postprocedure Evaluation (Signed)
Anesthesia Post Note  Patient: Megan Barrera  Procedure(s) Performed: LAPAROSCOPIC CHOLECYSTECTOMY WITH INTRAOPERATIVE CHOLANGIOGRAM (N/A Abdomen)     Patient location during evaluation: PACU Anesthesia Type: General Level of consciousness: awake and alert and oriented Pain management: pain level controlled Vital Signs Assessment: post-procedure vital signs reviewed and stable Respiratory status: spontaneous breathing, nonlabored ventilation and respiratory function stable Cardiovascular status: blood pressure returned to baseline Postop Assessment: no apparent nausea or vomiting Anesthetic complications: no   No complications documented.  Last Vitals:  Vitals:   02/28/21 1324 02/28/21 1440  BP: 126/72 121/64  Pulse: 62 (!) 57  Resp: 16 17  Temp: 36.6 C 36.7 C  SpO2: 98% 100%    Last Pain:  Vitals:   02/28/21 1440  TempSrc: Oral  PainSc:                  Kaylyn Layer

## 2021-02-28 NOTE — Anesthesia Procedure Notes (Signed)
Procedure Name: Intubation Date/Time: 02/28/2021 10:47 AM Performed by: Elyn Peers, CRNA Pre-anesthesia Checklist: Patient identified, Emergency Drugs available, Suction available, Patient being monitored and Timeout performed Patient Re-evaluated:Patient Re-evaluated prior to induction Oxygen Delivery Method: Circle system utilized Preoxygenation: Pre-oxygenation with 100% oxygen Induction Type: IV induction Ventilation: Mask ventilation without difficulty Laryngoscope Size: Miller and 3 Grade View: Grade I Tube type: Oral Tube size: 7.5 mm Number of attempts: 1 Airway Equipment and Method: Stylet Placement Confirmation: ETT inserted through vocal cords under direct vision,  positive ETCO2 and breath sounds checked- equal and bilateral Secured at: 22 cm Tube secured with: Tape Dental Injury: Teeth and Oropharynx as per pre-operative assessment

## 2021-03-01 ENCOUNTER — Encounter (HOSPITAL_COMMUNITY): Payer: Self-pay | Admitting: Surgery

## 2021-03-01 DIAGNOSIS — K828 Other specified diseases of gallbladder: Secondary | ICD-10-CM | POA: Diagnosis not present

## 2021-03-01 NOTE — Progress Notes (Signed)
Reviewed written d/c instructions w pt and all questions answered. Pt verbalized understanding. D/C per w/c w all belongings in stable condition. 

## 2021-03-04 LAB — SURGICAL PATHOLOGY

## 2021-03-12 ENCOUNTER — Encounter: Payer: Self-pay | Admitting: Nurse Practitioner

## 2021-03-12 ENCOUNTER — Other Ambulatory Visit: Payer: Self-pay

## 2021-03-12 ENCOUNTER — Ambulatory Visit (INDEPENDENT_AMBULATORY_CARE_PROVIDER_SITE_OTHER): Payer: 59 | Admitting: Nurse Practitioner

## 2021-03-12 VITALS — BP 136/88 | HR 67 | Temp 98.3°F | Ht 66.0 in | Wt 273.0 lb

## 2021-03-12 DIAGNOSIS — Z8619 Personal history of other infectious and parasitic diseases: Secondary | ICD-10-CM

## 2021-03-12 DIAGNOSIS — L089 Local infection of the skin and subcutaneous tissue, unspecified: Secondary | ICD-10-CM | POA: Diagnosis not present

## 2021-03-12 DIAGNOSIS — Z9049 Acquired absence of other specified parts of digestive tract: Secondary | ICD-10-CM | POA: Diagnosis not present

## 2021-03-12 MED ORDER — FLUCONAZOLE 150 MG PO TABS: 150.0000 mg | ORAL_TABLET | Freq: Once | ORAL | 0 refills | Status: AC

## 2021-03-12 NOTE — Progress Notes (Signed)
Established Patient Office Visit  Subjective:  Patient ID: Megan Barrera, female    DOB: 06-23-1986  Age: 35 y.o. MRN: [id number]  CC:  Chief Complaint  Patient presents with  . Post- Cholecystectomy skin infection        HPI Megan Barrera presents for evaluation of abdominal incisions s/p laparoscopic cholecystectomy on 02/28/21 with Dr Darnell Level. Jontae tells me the surgical procedure went well but she developed redness, rash, and drainage from abdominal incision sites approximately one week ago. She has contacted the surgeon's office to report post-op complications on 03/06/21. States she sent a photo of abdomen to office staff via her cell phone. She was prescribed a course of Prednisone and Keflex  for an adverse reaction to Dermabond skin adhesive that was used to close abdominal incisions x 4. States she has continued to experience small amount of drainage from umbilical incision. She denies fever, chills, or body aches.  Past Medical History:  Diagnosis Date  . Anemia   . Anxiety   . DDD (degenerative disc disease), lumbar   . Headache   . Pre-diabetes     Past Surgical History:  Procedure Laterality Date  . BACK SURGERY  11/21/2017   L4-L5 Dr Danielle Dess ruptured disc repair, denies hardware implanted  . CHOLECYSTECTOMY N/A 02/28/2021   Procedure: LAPAROSCOPIC CHOLECYSTECTOMY WITH INTRAOPERATIVE CHOLANGIOGRAM;  Surgeon: Darnell Level, MD;  Location: [facility];  Service: General;  Laterality: N/A;    Family History  Problem Relation Age of Onset  . Cirrhosis Father   . Alcohol abuse Father   . Hypertension Father   . Breast cancer Paternal Aunt   . Diabetes Maternal Grandmother   . Hypertension Maternal Grandmother     Social History   Socioeconomic History  . Marital status: Single    Spouse name: Not on file  . Number of children: Not on file  . Years of education: Not on file  . Highest education level: Not on file  Occupational History  . Not on file   Tobacco Use  . Smoking status: Never Smoker  . Smokeless tobacco: Never Used  Substance and Sexual Activity  . Alcohol use: Yes    Comment: occasionally  . Drug use: No  . Sexual activity: Yes    Birth control/protection: None  Other Topics Concern  . Not on file  Social History Narrative  . Not on file   Social Determinants of Health   Financial Resource Strain: Not on file  Food Insecurity: Not on file  Transportation Needs: Not on file  Physical Activity: Not on file  Stress: Not on file  Social Connections: Not on file  Intimate Partner Violence: Not on file    Outpatient Medications Prior to Visit  Medication Sig Dispense Refill  . cephALEXin (KEFLEX) 500 MG capsule Take 500 mg by mouth 3 (three) times daily.    . cetirizine (ZYRTEC) 10 MG tablet Take 10 mg by mouth daily.    . fluticasone (FLONASE) 50 MCG/ACT nasal spray Place 2 sprays into both nostrils daily. (Patient taking differently: Place 2 sprays into both nostrils daily as needed for allergies.) 16 g 6  . methylPREDNISolone (MEDROL DOSEPAK) 4 MG TBPK tablet Take by mouth as directed.    . Multiple Vitamin (MULTIVITAMIN WITH MINERALS) TABS tablet Take 1 tablet by mouth daily.    Marland Kitchen omeprazole (PRILOSEC) 20 MG capsule Take 1 capsule (20 mg total) by mouth daily. 90 capsule 1  . ondansetron (ZOFRAN ODT) 4 MG  disintegrating tablet Take 1 tablet (4 mg total) by mouth every 8 (eight) hours as needed for nausea or vomiting. 33 tablet 0  . Probiotic Product (PROBIOTIC PO) Take 2 capsules by mouth daily.    . traMADol (ULTRAM) 50 MG tablet Take 1-2 tablets (50-100 mg total) by mouth every 6 (six) hours as needed. 15 tablet 0  . amoxicillin-clavulanate (AUGMENTIN) 875-125 MG tablet Take 1 tablet by mouth 2 (two) times daily. 20 tablet 0  . fluconazole (DIFLUCAN) 150 MG tablet Take 150 mg by mouth once.     No facility-administered medications prior to visit.    Allergies  Allergen Reactions  . Fluvirin [Influenza  Virus Vaccine Split] Nausea And Vomiting  . Meloxicam Hives and Itching  . Tetanus Toxoids Hives and Swelling  . Nitrofurantoin Itching    ROS Review of Systems  Constitutional: Negative for chills and fever.  HENT: Negative for tinnitus.   Eyes: Negative.   Respiratory: Negative.   Cardiovascular: Negative.   Gastrointestinal: Negative for abdominal pain, constipation, diarrhea, nausea and vomiting.  Endocrine: Negative.   Genitourinary: Negative.   Musculoskeletal: Negative.   Skin: Positive for rash and wound (surgical incisions x 4 to abd).  Allergic/Immunologic: Negative for environmental allergies.  Neurological: Negative for dizziness and headaches.  Hematological: Negative.   Psychiatric/Behavioral: Negative.       Objective:    Physical Exam Vitals reviewed.  Constitutional:      Appearance: Normal appearance. She is obese.  Cardiovascular:     Rate and Rhythm: Normal rate and regular rhythm.     Pulses: Normal pulses.     Heart sounds: Normal heart sounds.  Pulmonary:     Effort: Pulmonary effort is normal.     Breath sounds: Normal breath sounds.  Abdominal:     General: Bowel sounds are normal.     Palpations: Abdomen is soft.       Comments: Scant serous drainage without odor noted to healing umbilical incision site; Pruritic papular rash noted to hypogastric region   Skin:    General: Skin is warm and dry.     Capillary Refill: Capillary refill takes less than 2 seconds.     Findings: Erythema and rash present. Rash is papular.  Neurological:     General: No focal deficit present.     Mental Status: She is alert and oriented to person, place, and time.  Psychiatric:        Mood and Affect: Mood normal.        Behavior: Behavior normal.   BP 136/88 (BP Location: Left Arm, Patient Position: Sitting)   Pulse 67   Temp 98.3 F (36.8 C) (Temporal)   Ht 5\' 6"  (1.676 m)   Wt 273 lb (123.8 kg)   LMP 02/26/2021 Comment: urine preg.pending.02/28/21   SpO2 97%   BMI 44.06 kg/m   Ht 5\' 6"  (1.676 m)   LMP 02/26/2021 Comment: urine preg.pending.02/28/21  BMI 43.41 kg/m  Wt Readings from Last 3 Encounters:  02/28/21 268 lb 15.4 oz (122 kg)  02/21/21 269 lb (122 kg)  02/11/21 272 lb (123.4 kg)     Health Maintenance Due  Topic Date Due  . COVID-19 Vaccine (1) Never done  . Pneumococcal Vaccine 87-37 Years old (1 of 2 - PPSV23) Never done  . HIV Screening  Never done  . Hepatitis C Screening  Never done  . TETANUS/TDAP  Never done  . PAP SMEAR-Modifier  Never done     Lab Results  Component Value Date   TSH 1.560 11/07/2020   Lab Results  Component Value Date   WBC 7.2 02/21/2021   HGB 13.7 02/21/2021   HCT 42.8 02/21/2021   MCV 94.7 02/21/2021   PLT 354 02/21/2021   Lab Results  Component Value Date   NA 137 02/21/2021   K 4.0 02/21/2021   CO2 29 02/21/2021   GLUCOSE 91 02/21/2021   BUN 10 02/21/2021   CREATININE 0.88 02/21/2021   BILITOT 0.4 11/21/2020   ALKPHOS 113 11/21/2020   AST 72 (H) 11/21/2020   ALT 86 (H) 11/21/2020   PROT 6.8 11/21/2020   ALBUMIN 4.1 11/21/2020   CALCIUM 8.9 02/21/2021   ANIONGAP 5 02/21/2021   Lab Results  Component Value Date   CHOL 161 11/07/2020   Lab Results  Component Value Date   HDL 44 11/07/2020   Lab Results  Component Value Date   LDLCALC 100 (H) 11/07/2020   Lab Results  Component Value Date   TRIG 91 11/07/2020   Lab Results  Component Value Date   CHOLHDL 3.7 11/07/2020   Lab Results  Component Value Date   HGBA1C 5.4 02/21/2021      Assessment & Plan:   1. S/P laparoscopic cholecystectomy -Follow-up with [facility] as scheduled  2. Skin infection -Continue Keflex as prescribed -Continue Prednisone as prescribed  3. History of candidiasis - fluconazole (DIFLUCAN) 150 MG tablet; Take 1 tablet (150 mg total) by mouth once for 1 dose.  Dispense: 1 tablet; Refill: 0   Follow-up with [facility] tomorrow at June  9th, 2022 at 1:30  Continue antibiotics as prescribed    Follow-up:  PRN  Signed, Janie Morning, NP

## 2021-03-12 NOTE — Patient Instructions (Signed)
Follow-up with [facility] tomorrow at June 9th, 2022 at 1:30  Continue antibiotics as prescribed    Cellulitis, Adult  Cellulitis is a skin infection. The infected area is often warm, red, swollen, and sore. It occurs most often in the arms and lower legs. It is very important to get treated for this condition. What are the causes? This condition is caused by bacteria. The bacteria enter through a break in the skin, such as a cut, burn, insect bite, open sore, or crack. What increases the risk? This condition is more likely to occur in people who:  Have a weak body defense system (immune system).  Have open cuts, burns, bites, or scrapes on the skin.  Are older than 33 years of age.  Have a blood sugar problem (diabetes).  Have a long-lasting (chronic) liver disease (cirrhosis) or kidney disease.  Are very overweight (obese).  Have a skin problem, such as: ? Itchy rash (eczema). ? Slow movement of blood in the veins (venous stasis). ? Fluid buildup below the skin (edema).  Have been treated with high-energy rays (radiation).  Use IV drugs. What are the signs or symptoms? Symptoms of this condition include:  Skin that is: ? Red. ? Streaking. ? Spotting. ? Swollen. ? Sore or painful when you touch it. ? Warm.  A fever.  Chills.  Blisters. How is this diagnosed? This condition is diagnosed based on:  Medical history.  Physical exam.  Blood tests.  Imaging tests. How is this treated? Treatment for this condition may include:  Medicines to treat infections or allergies.  Home care, such as: ? Rest. ? Placing cold or warm cloths (compresses) on the skin.  Hospital care, if the condition is very bad. Follow these instructions at home: Medicines  Take over-the-counter and prescription medicines only as told by your doctor.  If you were prescribed an antibiotic medicine, take it as told by your doctor. Do not stop taking it even if you  start to feel better. General instructions  Drink enough fluid to keep your pee (urine) pale yellow.  Do not touch or rub the infected area.  Raise (elevate) the infected area above the level of your heart while you are sitting or lying down.  Place cold or warm cloths on the area as told by your doctor.  Keep all follow-up visits as told by your doctor. This is important.   Contact a doctor if:  You have a fever.  You do not start to get better after 1-2 days of treatment.  Your bone or joint under the infected area starts to hurt after the skin has healed.  Your infection comes back. This can happen in the same area or another area.  You have a swollen bump in the area.  You have new symptoms.  You feel ill and have muscle aches and pains. Get help right away if:  Your symptoms get worse.  You feel very sleepy.  You throw up (vomit) or have watery poop (diarrhea) for a long time.  You see red streaks coming from the area.  Your red area gets larger.  Your red area turns dark in color. These symptoms may represent a serious problem that is an emergency. Do not wait to see if the symptoms will go away. Get medical help right away. Call your local emergency services (911 in the [place] Do not drive yourself to the hospital. Summary  Cellulitis is a skin infection. The area is often warm, red,  swollen, and sore.  This condition is treated with medicines, rest, and cold and warm cloths.  Take all medicines only as told by your doctor.  Tell your doctor if symptoms do not start to get better after 1-2 days of treatment. This information is not intended to replace advice given to you by your health care provider. Make sure you discuss any questions you have with your health care provider. Document Revised: 02/10/2018 Document Reviewed: 02/10/2018 Elsevier Patient Education  Megan Barrera.

## 2021-03-17 ENCOUNTER — Telehealth: Payer: Self-pay

## 2021-03-17 NOTE — Telephone Encounter (Signed)
Megan Barrera called with concerns about drainage from her surgical incision.  She reports that most of the areas are healing except for the umbilical incision.  She is reporting serous drainage with no fever or chills.  Symptoms discussed w/Shannon Judi Saa, NP and she advised follow-up with her surgeon.

## 2021-06-27 ENCOUNTER — Encounter: Payer: Self-pay | Admitting: Obstetrics and Gynecology

## 2021-06-27 ENCOUNTER — Ambulatory Visit (INDEPENDENT_AMBULATORY_CARE_PROVIDER_SITE_OTHER): Payer: 59 | Admitting: Obstetrics and Gynecology

## 2021-06-27 ENCOUNTER — Other Ambulatory Visit: Payer: Self-pay

## 2021-06-27 ENCOUNTER — Other Ambulatory Visit: Payer: Self-pay | Admitting: Obstetrics and Gynecology

## 2021-06-27 DIAGNOSIS — Z124 Encounter for screening for malignant neoplasm of cervix: Secondary | ICD-10-CM | POA: Diagnosis not present

## 2021-06-27 DIAGNOSIS — Z01419 Encounter for gynecological examination (general) (routine) without abnormal findings: Secondary | ICD-10-CM

## 2021-06-27 DIAGNOSIS — L732 Hidradenitis suppurativa: Secondary | ICD-10-CM | POA: Diagnosis not present

## 2021-06-27 DIAGNOSIS — Z30019 Encounter for initial prescription of contraceptives, unspecified: Secondary | ICD-10-CM

## 2021-06-27 LAB — POCT URINE PREGNANCY: Preg Test, Ur: NEGATIVE

## 2021-06-27 MED ORDER — DOXYCYCLINE HYCLATE 100 MG PO CAPS: 100.0000 mg | ORAL_CAPSULE | Freq: Every day | ORAL | 1 refills | Status: AC

## 2021-06-27 NOTE — Progress Notes (Signed)
HPI:      [name] is a 35 y.o. G1P1000 who LMP was No LMP recorded.  Subjective:   She presents today for her annual examination.  She states that she is having regular monthly periods.  She has had unprotected intercourse for 11 years without resultant pregnancy (since the birth of her child). Her current partner has been the same 1 for the last 11 years.  He has never fathered a child. She is considering birth control. She has hidradenitis underneath her arms that typically flares up "during the summer".  She occasionally takes doxycycline for this. She states that she is up-to-date on Pap smears and does not need one until next year.    Hx: The following portions of the patient's history were reviewed and updated as appropriate:             She  has a past medical history of Anemia, Anxiety, DDD (degenerative disc disease), lumbar, Headache, and Pre-diabetes. She does not have any pertinent problems on file. She  has a past surgical history that includes Back surgery (11/21/2017) and Cholecystectomy (N/A, 02/28/2021). Her family history includes Alcohol abuse in her father; Breast cancer in her paternal aunt; Cirrhosis in her father; Diabetes in her maternal grandmother; Hypertension in her father and maternal grandmother. She  reports that she has never smoked. She has never used smokeless tobacco. She reports current alcohol use. She reports that she does not use drugs. She has a current medication list which includes the following prescription(s): loratadine, cephalexin, cetirizine, fluticasone, methylprednisolone, multivitamin with minerals, omeprazole, ondansetron, probiotic product, and tramadol. She is allergic to fluvirin [influenza vac split quad], meloxicam, tetanus toxoids, 2-octylcyanoacrylate [cyanoacrylate], and nitrofurantoin.       Review of Systems:  Review of Systems  Constitutional: Denied constitutional symptoms, night sweats, recent illness, fatigue, fever,  insomnia and weight loss.  Eyes: Denied eye symptoms, eye pain, photophobia, vision change and visual disturbance.  Ears/Nose/Throat/Neck: Denied ear, nose, throat or neck symptoms, hearing loss, nasal discharge, sinus congestion and sore throat.  Cardiovascular: Denied cardiovascular symptoms, arrhythmia, chest pain/pressure, edema, exercise intolerance, orthopnea and palpitations.  Respiratory: Denied pulmonary symptoms, asthma, pleuritic pain, productive sputum, cough, dyspnea and wheezing.  Gastrointestinal: Denied, gastro-esophageal reflux, melena, nausea and vomiting.  Genitourinary: Denied genitourinary symptoms including symptomatic vaginal discharge, pelvic relaxation issues, and urinary complaints.  Musculoskeletal: Denied musculoskeletal symptoms, stiffness, swelling, muscle weakness and myalgia.  Dermatologic: Denied dermatology symptoms, rash and scar.  Neurologic: Denied neurology symptoms, dizziness, headache, neck pain and syncope.  Psychiatric: Denied psychiatric symptoms, anxiety and depression.  Endocrine: Denied endocrine symptoms including hot flashes and night sweats.   Meds:   Current Outpatient Medications on File Prior to Visit  Medication Sig Dispense Refill   loratadine (CLARITIN) 10 MG tablet Take by mouth.     cephALEXin (KEFLEX) 500 MG capsule Take 500 mg by mouth 3 (three) times daily.     cetirizine (ZYRTEC) 10 MG tablet Take 10 mg by mouth daily.     fluticasone (FLONASE) 50 MCG/ACT nasal spray Place 2 sprays into both nostrils daily. (Patient taking differently: Place 2 sprays into both nostrils daily as needed for allergies.) 16 g 6   methylPREDNISolone (MEDROL DOSEPAK) 4 MG TBPK tablet Take by mouth as directed.     Multiple Vitamin (MULTIVITAMIN WITH MINERALS) TABS tablet Take 1 tablet by mouth daily.     omeprazole (PRILOSEC) 20 MG capsule Take 1 capsule (20 mg total) by mouth daily. 90 capsule  1   ondansetron (ZOFRAN ODT) 4 MG disintegrating tablet Take  1 tablet (4 mg total) by mouth every 8 (eight) hours as needed for nausea or vomiting. 33 tablet 0   Probiotic Product (PROBIOTIC PO) Take 2 capsules by mouth daily.     traMADol (ULTRAM) 50 MG tablet Take 1-2 tablets (50-100 mg total) by mouth every 6 (six) hours as needed. 15 tablet 0   No current facility-administered medications on file prior to visit.     Upstream - 06/27/21 1216       Contraception Wrap Up   Current Method No Method - Other Reason    End Method No Method - Other Reason    Contraception Counseling Provided Yes            The pregnancy intention screening data noted above was reviewed. Potential methods of contraception were discussed. The patient elected to proceed with No Method - Other Reason.    Objective:     There were no vitals filed for this visit.  There were no vitals filed for this visit.            Physical examination General NAD, Conversant  HEENT Atraumatic; Op clear with mmm.  Normo-cephalic. Pupils reactive. Anicteric sclerae  Thyroid/Neck Smooth without nodularity or enlargement. Normal ROM.  Neck Supple.  Skin Axillary hidradenitis noted  Breasts: No masses or discharge.  Symmetric.  No axillary adenopathy.  Lungs: Clear to auscultation.No rales or wheezes. Normal Respiratory effort, no retractions.  Heart: NSR.  No murmurs or rubs appreciated. No periferal edema  Abdomen: Soft.  Non-tender.  No masses.  No HSM. No hernia  Extremities: Moves all appropriately.  Normal ROM for age. No lymphadenopathy.  Neuro: Oriented to PPT.  Normal mood. Normal affect.     Pelvic:   Vulva: Normal appearance.  No lesions.  Vagina: No lesions or abnormalities noted.  Support: Normal pelvic support.  Urethra No masses tenderness or scarring.  Meatus Normal size without lesions or prolapse.  Cervix: Normal appearance.  No lesions.  Anus: Normal exam.  No lesions.  Perineum: Normal exam.  No lesions.        Bimanual   Uterus: Normal size.   Non-tender.  Mobile.  AV.  Adnexae: No masses.  Non-tender to palpation.  Cul-de-sac: Negative for abnormality.     Assessment:    G1P1000 Patient Active Problem List   Diagnosis Date Noted   Biliary dyskinesia 02/15/2021   Abdominal pain, right upper quadrant 02/15/2021   Anovulation 06/07/2020   History of migraine 06/07/2020   Oligomenorrhea 06/07/2020     1. Well woman exam with routine gynecological exam   2. Encounter for initial prescription of contraceptives, unspecified contraceptive   3. Cervical cancer screening   4. Hydradenitis   5. Morbid obesity (HCC)        Plan:            1.  Basic Screening Recommendations The basic screening recommendations for asymptomatic women were discussed with the patient during her visit.  The age-appropriate recommendations were discussed with her and the rational for the tests reviewed.  When I am informed by the patient that another primary care physician has previously obtained the age-appropriate tests and they are up-to-date, only outstanding tests are ordered and referrals given as necessary.  Abnormal results of tests will be discussed with her when all of her results are completed.  Routine preventative health maintenance measures emphasized: Exercise/Diet/Weight control, Tobacco Warnings, Alcohol/Substance use risks  and Stress Management Pap next year. 2.  Birth control methods discussed in detail.  Possible female infertility discussed in detail.  Relative contraindication of elevated BMI and combination OCPs discussed.  Patient seems most interested in IUD at this point. 3.  Doxycycline prescribed for hidradenitis -strongly recommended birth control prior to initiation of Doxy.  Orders Orders Placed This Encounter  Procedures   POCT urine pregnancy    No orders of the defined types were placed in this encounter.         F/U  Return for She is to call at the start of next menses.  Elonda Husky,  M.D. 06/27/2021 12:22 PM

## 2021-06-30 ENCOUNTER — Telehealth: Payer: Self-pay | Admitting: Obstetrics and Gynecology

## 2021-06-30 NOTE — Telephone Encounter (Signed)
Pt called asking about update on RX- [facility] states they had questions about directions and that they sent fax to office to get clarification. Please Advise.

## 2021-06-30 NOTE — Telephone Encounter (Signed)
Called pharmacy to inform them that the directions should be 1 capsule twice daily for 7 days.

## 2022-03-21 ENCOUNTER — Other Ambulatory Visit: Payer: Self-pay

## 2022-03-21 ENCOUNTER — Ambulatory Visit
Admission: RE | Admit: 2022-03-21 | Discharge: 2022-03-21 | Disposition: A | Discharge status: Home / Self Care | Payer: BC Managed Care – PPO | Source: Ambulatory Visit

## 2022-03-21 VITALS — BP 120/86 | HR 80 | Temp 98.8°F | Resp 18

## 2022-03-21 DIAGNOSIS — R1031 Right lower quadrant pain: Secondary | ICD-10-CM

## 2022-03-21 DIAGNOSIS — R0981 Nasal congestion: Secondary | ICD-10-CM

## 2022-03-21 DIAGNOSIS — R051 Acute cough: Secondary | ICD-10-CM

## 2022-03-21 NOTE — Discharge Instructions (Addendum)
Go to the emergency department for evaluation of your right lower abdominal pain and other symptoms.

## 2022-03-21 NOTE — ED Provider Notes (Signed)
Renaldo Fiddler    CSN: [id number] Arrival date & time: 03/21/22  1248      History   Chief Complaint Chief Complaint  Patient presents with   Nasal Congestion    HPI Megan Barrera is a 36 y.o. female.  Patient presents with right lower quadrant abdominal pain intermittently since last night.  She states the pain woke her up twice last night.  She also reports sinus congestion and cough x10 days.  She denies fever, chills, nausea, vomiting, diarrhea, dysuria, current sore throat, shortness of breath, or other symptoms.  She had a sore throat last week but this resolved with treatment.  She was seen at another urgent care last weekend and was treated with Augmentin and prednisone; she states she was given an antibiotic injection and steroid injection there at the urgent care but does not know the name of the antibiotic; She states her strep, flu, COVID test were negative at that time.  She states her sinus congestion and mild intermittent occasionally productive cough have persisted.  The history is provided by the patient and medical records.    Past Medical History:  Diagnosis Date   Anemia    Anxiety    DDD (degenerative disc disease), lumbar    Headache    Pre-diabetes     Patient Active Problem List   Diagnosis Date Noted   Biliary dyskinesia 02/15/2021   Abdominal pain, right upper quadrant 02/15/2021   Anovulation 06/07/2020   History of migraine 06/07/2020   Oligomenorrhea 06/07/2020    Past Surgical History:  Procedure Laterality Date   BACK SURGERY  11/21/2017   L4-L5 Dr Danielle Dess ruptured disc repair, denies hardware implanted   CHOLECYSTECTOMY N/A 02/28/2021   Procedure: LAPAROSCOPIC CHOLECYSTECTOMY WITH INTRAOPERATIVE CHOLANGIOGRAM;  Surgeon: Darnell Level, MD;  Location: [facility];  Service: General;  Laterality: N/A;    OB History     Gravida  1   Para  1   Term  1   Preterm      AB      Living         SAB      IAB      Ectopic       Multiple      Live Births               Home Medications    Prior to Admission medications   Medication Sig Start Date End Date Taking? Authorizing Provider  methylPREDNISolone (MEDROL DOSEPAK) 4 MG TBPK tablet Take by mouth as directed. 03/06/21  Yes [provider]  amoxicillin-clavulanate (AUGMENTIN) 875-125 MG tablet Take 1 tablet by mouth 2 (two) times daily. 03/13/22   [provider]  cephALEXin (KEFLEX) 500 MG capsule Take 500 mg by mouth 3 (three) times daily. Patient not taking: Reported on 03/21/2022 03/06/21   [provider]  cetirizine (ZYRTEC) 10 MG tablet Take 10 mg by mouth daily.    [provider]  fluticasone (FLONASE) 50 MCG/ACT nasal spray Place 2 sprays into both nostrils daily. Patient taking differently: Place 2 sprays into both nostrils daily as needed for allergies. 01/15/21   Janie Morning, NP  loratadine (CLARITIN) 10 MG tablet Take by mouth. Patient not taking: Reported on 03/21/2022    [provider]  Multiple Vitamin (MULTIVITAMIN WITH MINERALS) TABS tablet Take 1 tablet by mouth daily.    [provider]  omeprazole (PRILOSEC) 20 MG capsule Take 1 capsule (20 mg total) by mouth daily.  11/26/20   Janie Morning, NP  ondansetron (ZOFRAN ODT) 4 MG disintegrating tablet Take 1 tablet (4 mg total) by mouth every 8 (eight) hours as needed for nausea or vomiting. Patient not taking: Reported on 03/21/2022 11/26/20   Janie Morning, NP  Probiotic Product (PROBIOTIC PO) Take 2 capsules by mouth daily.    [provider]  traMADol (ULTRAM) 50 MG tablet Take 1-2 tablets (50-100 mg total) by mouth every 6 (six) hours as needed. Patient not taking: Reported on 03/21/2022 02/28/21   Darnell Level, MD    Family History Family History  Problem Relation Age of Onset   Cirrhosis Father    Alcohol abuse Father    Hypertension Father    Breast cancer Paternal Aunt    Diabetes Maternal Grandmother     Hypertension Maternal Grandmother     Social History Social History   Tobacco Use   Smoking status: Never   Smokeless tobacco: Never  Vaping Use   Vaping Use: Never used  Substance Use Topics   Alcohol use: Yes    Comment: occasionally   Drug use: No     Allergies   Fluvirin [influenza vac split quad], Meloxicam, Tetanus toxoids, 2-octylcyanoacrylate [cyanoacrylate], and Nitrofurantoin   Review of Systems Review of Systems  Constitutional:  Negative for chills and fever.  HENT:  Positive for congestion. Negative for ear pain and sore throat.   Respiratory:  Positive for cough. Negative for shortness of breath.   Cardiovascular:  Negative for chest pain and palpitations.  Gastrointestinal:  Positive for abdominal pain. Negative for constipation, diarrhea, nausea and vomiting.  Genitourinary:  Negative for dysuria and hematuria.  Skin:  Negative for color change and rash.  All other systems reviewed and are negative.    Physical Exam Triage Vital Signs ED Triage Vitals  Enc Vitals Group     BP      Pulse      Resp      Temp      Temp src      SpO2      Weight      Height      Head Circumference      Peak Flow      Pain Score      Pain Loc      Pain Edu?      Excl. in GC?    No data found.  Updated Vital Signs BP 120/86 (BP Location: Left Arm) Comment (BP Location): large cuff  Pulse 80   Temp 98.8 F (37.1 C) (Oral)   Resp 18   LMP 03/05/2022   SpO2 97%   Visual Acuity Right Eye Distance:   Left Eye Distance:   Bilateral Distance:    Right Eye Near:   Left Eye Near:    Bilateral Near:     Physical Exam Vitals and nursing note reviewed.  Constitutional:      General: She is not in acute distress.    Appearance: She is well-developed. She is obese. She is not ill-appearing.  HENT:     Right Ear: Tympanic membrane normal.     Left Ear: Tympanic membrane normal.     Nose: Congestion present.     Mouth/Throat:     Mouth: Mucous membranes  are moist.     Pharynx: Oropharynx is clear.  Cardiovascular:     Rate and Rhythm: Normal rate and regular rhythm.     Heart sounds: Normal heart sounds.  Pulmonary:  Effort: Pulmonary effort is normal. No respiratory distress.     Breath sounds: Normal breath sounds.  Abdominal:     General: Bowel sounds are normal.     Palpations: Abdomen is soft.     Tenderness: There is abdominal tenderness in the right lower quadrant. There is no guarding.     Comments: Mild tenderness to palpation of RLQ. No rebound or guarding.   Musculoskeletal:     Cervical back: Neck supple.  Skin:    General: Skin is warm and dry.  Neurological:     General: No focal deficit present.     Mental Status: She is alert and oriented to person, place, and time.     Gait: Gait normal.  Psychiatric:        Mood and Affect: Mood normal.        Behavior: Behavior normal.      UC Treatments / Results  Labs (all labs ordered are listed, but only abnormal results are displayed) Labs Reviewed - No data to display  EKG   Radiology No results found.  Procedures Procedures (including critical care time)  Medications Ordered in UC Medications - No data to display  Initial Impression / Assessment and Plan / UC Course  I have reviewed the triage vital signs and the nursing notes.  Pertinent labs & imaging results that were available during my care of the patient were reviewed by me and considered in my medical decision making (see chart for details).  Right lower quadrant abdominal pain, nasal congestion, cough.  Right lower quadrant abdomen is tender to palpation.  Patient is currently on Augmentin and was on prednisone also for sinus type symptoms; these were prescribed at another urgent care.  I discussed the limitations of the evaluation of abdominal pain in an urgent care setting.  Sending patient to the ED for evaluation.  She is agreeable to this.  She feels stable to travel POV and has significant  other with her to drive her.   Final Clinical Impressions(s) / UC Diagnoses   Final diagnoses:  Right lower quadrant abdominal pain  Nasal congestion  Acute cough     Discharge Instructions      Go to the emergency department for evaluation of your right lower abdominal pain and other symptoms.     ED Prescriptions   None    PDMP not reviewed this encounter.   Mickie Bail, NP 03/21/22 1406

## 2022-03-21 NOTE — ED Notes (Signed)
Patient is being discharged from the Urgent Care and sent to the Emergency Department via pov . Per Wendee Beavers, NP, patient is in need of higher level of care due to lower right abdominal pain. Patient is aware and verbalizes understanding of plan of care.  Vitals:   03/21/22 1338  BP: 120/86  Pulse: 80  Resp: 18  Temp: 98.8 F (37.1 C)  SpO2: 97%

## 2022-03-21 NOTE — ED Triage Notes (Signed)
Patient reports symptoms started around 6/5.  Went to [facility] around 6/9.  They did covid, strep, and flu and all negative.  Patient was prescribed augmentin and prednisone, and an antibiotic and steroid shot.  Patient says she is improving, but not well and has one day left of antibiotic  General soreness, right side of face and neck is sore.  Coughing up clear to brownish phlegm.  Feels like she is in a "fish bowl" and teeth hurt

## 2022-08-11 IMAGING — RF DG CHOLANGIOGRAM OPERATIVE
1 series · 4 of 4 positions shown · non-contrast
Comparison: 12/03/2020, 11/22/2020

CLINICAL DATA: Elective cholecystectomy, biliary dyskinesia

EXAM:
INTRAOPERATIVE CHOLANGIOGRAM
TECHNIQUE: Cholangiographic images from the C-arm fluoroscopic device were
submitted for interpretation post-operatively. Please see the
procedural report for the amount of contrast and the fluoroscopy
time utilized.

[Series 1: run · 4 of 90 frames shown]
[frame 14/90]
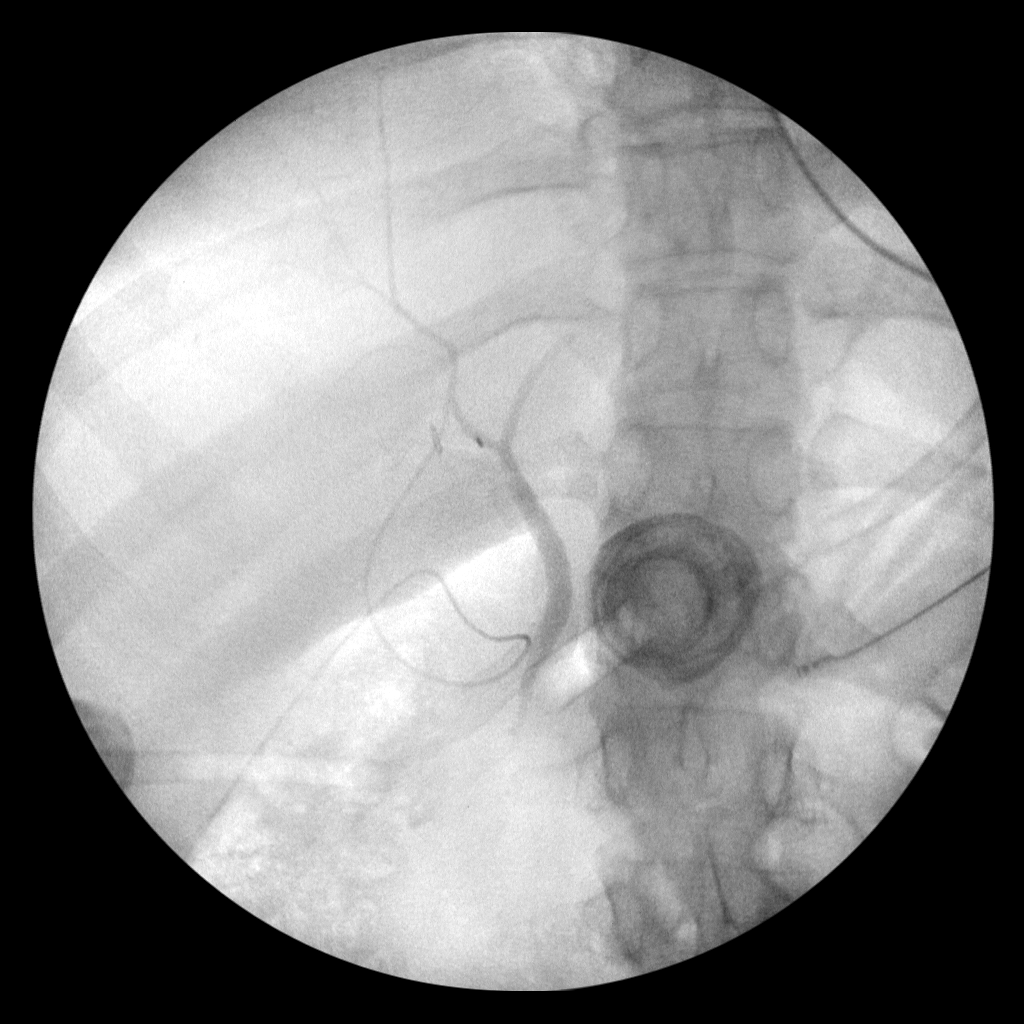
[frame 46/90]
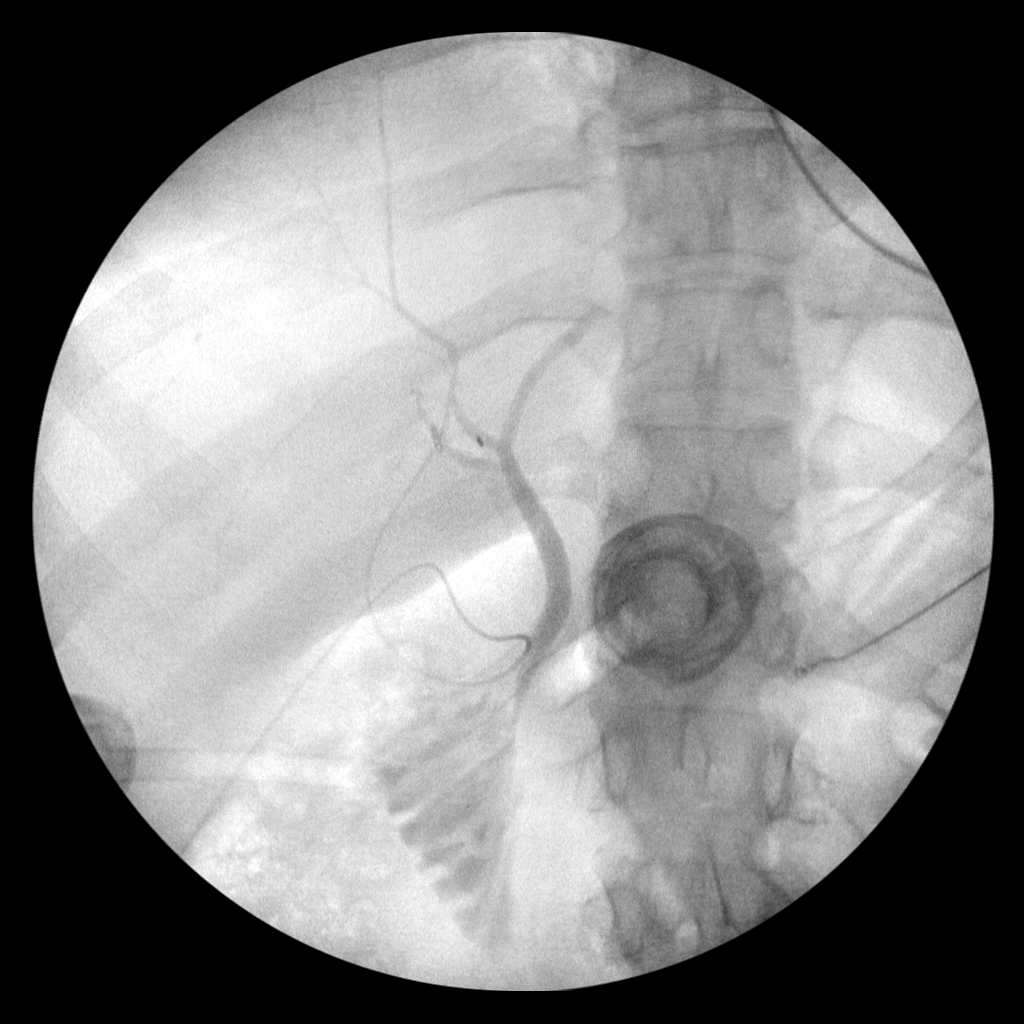
[frame 59/90]
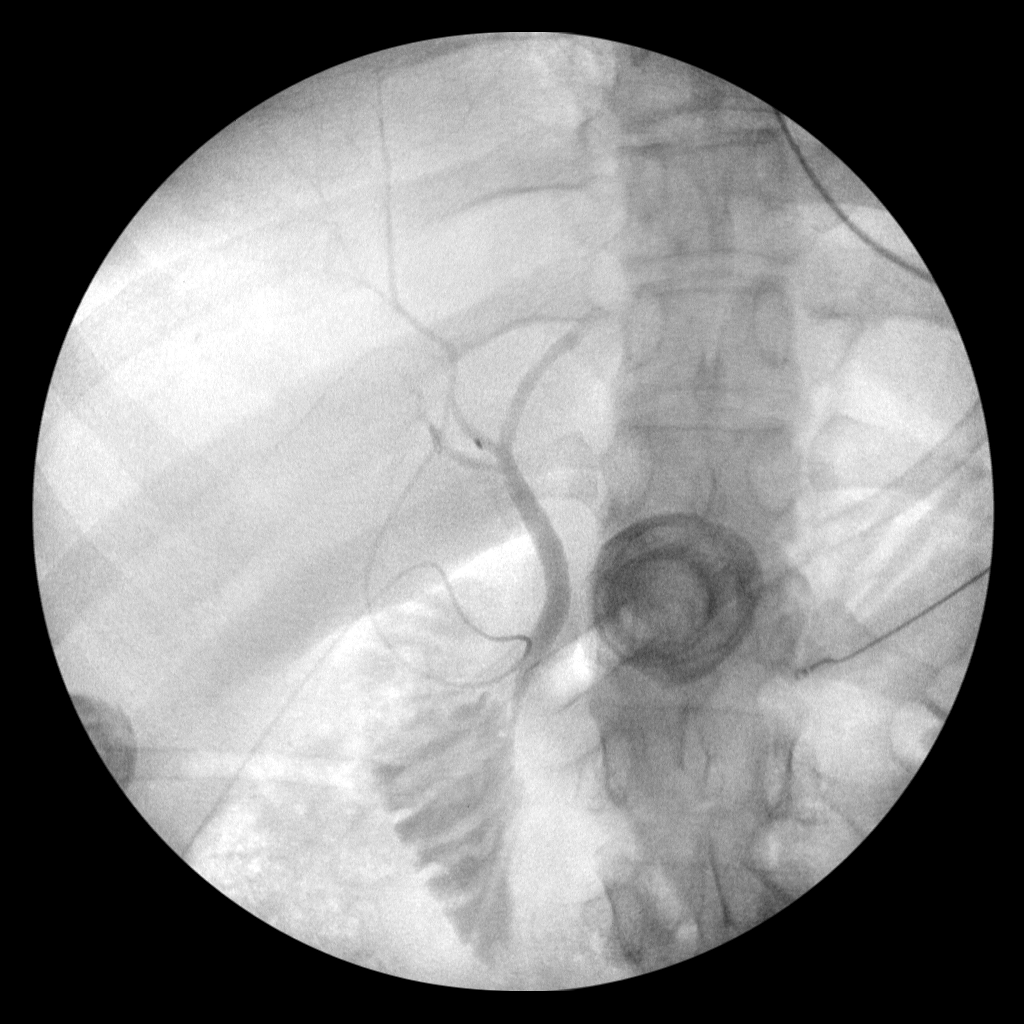
[frame 77/90]
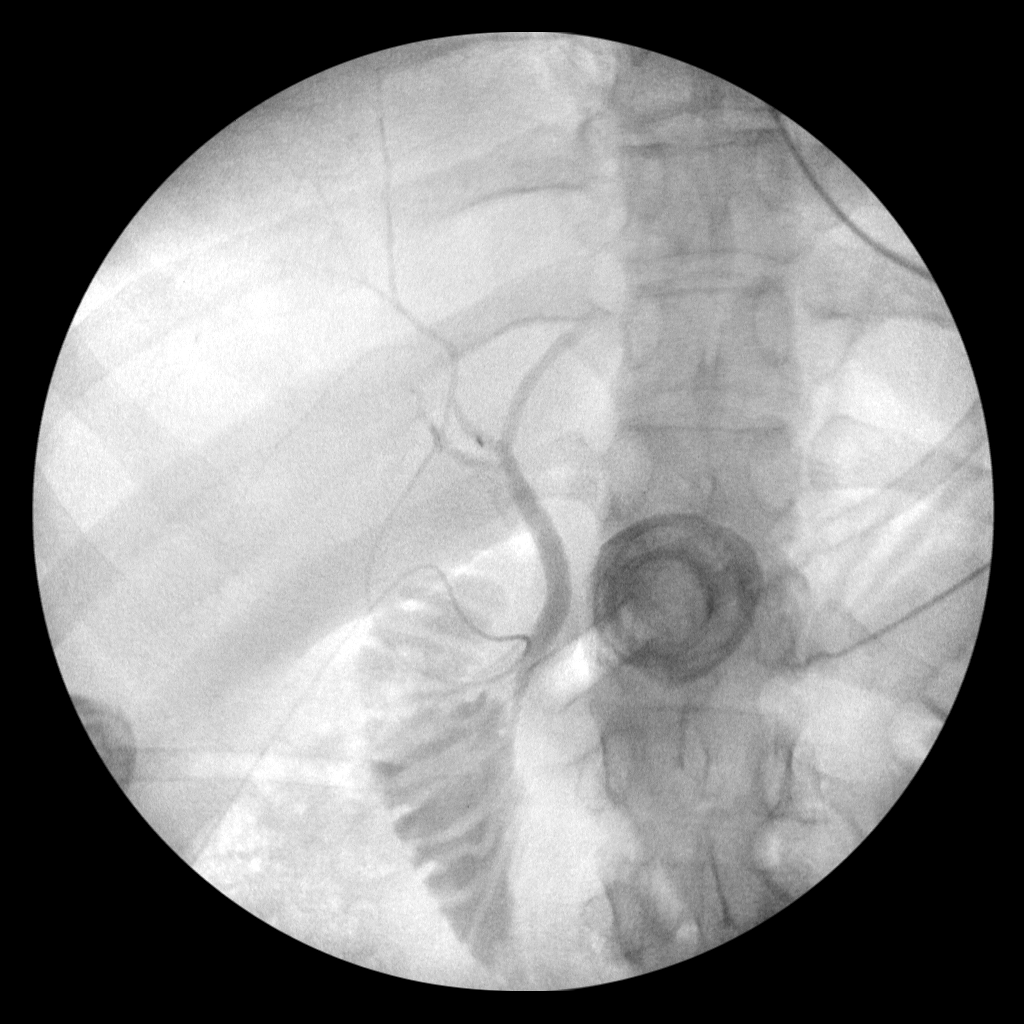

[4 of 4 positions shown; findings below may reference images not displayed]

FINDINGS: Intraoperative cholangiogram performed during laparoscopic
procedure. The residual cystic duct, biliary confluence, common
hepatic duct, and common bile duct are patent. Contrast easily
drains into the duodenum. No dilatation, obstruction, stricture or
large filling defect.
IMPRESSION: patent biliary system.
